# Patient Record
Sex: Male | Born: 1985 | Race: Black or African American | Hispanic: No | Marital: Single | Smoking: Never smoker
Health system: Southern US, Community
[De-identification: ages and names within clinical notes are randomized; demographics above are authoritative.]

## PROBLEM LIST (undated history)

## (undated) DIAGNOSIS — F909 Attention-deficit hyperactivity disorder, unspecified type: Secondary | ICD-10-CM

## (undated) DIAGNOSIS — R519 Headache, unspecified: Secondary | ICD-10-CM

## (undated) DIAGNOSIS — W3400XA Accidental discharge from unspecified firearms or gun, initial encounter: Secondary | ICD-10-CM

## (undated) DIAGNOSIS — R51 Headache: Secondary | ICD-10-CM

---
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
## 2004-09-12 ENCOUNTER — Emergency Department (HOSPITAL_COMMUNITY): Admission: EM | Admit: 2004-09-12 | Discharge: 2004-09-12 | Payer: Self-pay | Admitting: Emergency Medicine

## 2004-09-14 ENCOUNTER — Emergency Department (HOSPITAL_COMMUNITY): Admission: EM | Admit: 2004-09-14 | Discharge: 2004-09-14 | Payer: Self-pay | Admitting: Emergency Medicine

## 2004-09-17 ENCOUNTER — Emergency Department (HOSPITAL_COMMUNITY): Admission: EM | Admit: 2004-09-17 | Discharge: 2004-09-17 | Payer: Self-pay | Admitting: Emergency Medicine

## 2004-09-27 ENCOUNTER — Ambulatory Visit (HOSPITAL_COMMUNITY): Admission: RE | Admit: 2004-09-27 | Discharge: 2004-09-27 | Payer: Self-pay | Admitting: General Surgery

## 2005-05-31 ENCOUNTER — Emergency Department (HOSPITAL_COMMUNITY): Admission: EM | Admit: 2005-05-31 | Discharge: 2005-05-31 | Payer: Self-pay | Admitting: Emergency Medicine

## 2006-03-29 ENCOUNTER — Emergency Department (HOSPITAL_COMMUNITY): Admission: EM | Admit: 2006-03-29 | Discharge: 2006-03-29 | Payer: Self-pay | Admitting: Emergency Medicine

## 2010-03-31 ENCOUNTER — Emergency Department (HOSPITAL_COMMUNITY): Admission: EM | Admit: 2010-03-31 | Discharge: 2010-04-01 | Payer: Self-pay | Admitting: Emergency Medicine

## 2010-04-16 ENCOUNTER — Emergency Department (HOSPITAL_COMMUNITY): Admission: EM | Admit: 2010-04-16 | Discharge: 2010-04-16 | Payer: Self-pay | Admitting: Emergency Medicine

## 2013-08-09 ENCOUNTER — Encounter (HOSPITAL_COMMUNITY): Payer: Self-pay | Admitting: Emergency Medicine

## 2013-08-09 ENCOUNTER — Emergency Department (HOSPITAL_COMMUNITY)
Admission: EM | Admit: 2013-08-09 | Discharge: 2013-08-10 | Disposition: A | Discharge status: Home / Self Care | Payer: Self-pay | Attending: Emergency Medicine | Admitting: Emergency Medicine

## 2013-08-09 DIAGNOSIS — M549 Dorsalgia, unspecified: Secondary | ICD-10-CM

## 2013-08-09 DIAGNOSIS — R071 Chest pain on breathing: Secondary | ICD-10-CM | POA: Insufficient documentation

## 2013-08-09 DIAGNOSIS — F1210 Cannabis abuse, uncomplicated: Secondary | ICD-10-CM | POA: Insufficient documentation

## 2013-08-09 DIAGNOSIS — F17200 Nicotine dependence, unspecified, uncomplicated: Secondary | ICD-10-CM | POA: Insufficient documentation

## 2013-08-09 DIAGNOSIS — M546 Pain in thoracic spine: Secondary | ICD-10-CM | POA: Insufficient documentation

## 2013-08-09 DIAGNOSIS — R079 Chest pain, unspecified: Secondary | ICD-10-CM

## 2013-08-09 LAB — CBC
HCT: 41.7 % (ref 39.0–52.0)
Hemoglobin: 13.7 g/dL (ref 13.0–17.0)
MCHC: 32.9 g/dL (ref 30.0–36.0)
RBC: 4.43 MIL/uL (ref 4.22–5.81)
WBC: 6.2 10*3/uL (ref 4.0–10.5)

## 2013-08-09 LAB — BASIC METABOLIC PANEL
BUN: 10 mg/dL (ref 6–23)
CO2: 30 mEq/L (ref 19–32)
Chloride: 99 mEq/L (ref 96–112)
Glucose, Bld: 59 mg/dL — ABNORMAL LOW (ref 70–99)
Potassium: 3.9 mEq/L (ref 3.5–5.1)
Sodium: 138 mEq/L (ref 135–145)

## 2013-08-09 LAB — POCT I-STAT TROPONIN I

## 2013-08-09 MED ORDER — KETOROLAC TROMETHAMINE 60 MG/2ML IM SOLN
60.0000 mg | Freq: Once | INTRAMUSCULAR | Status: AC
  Administered 2013-08-09: 60 mg via INTRAMUSCULAR
  Filled 2013-08-09: qty 2

## 2013-08-09 NOTE — ED Notes (Signed)
Patient with chest pain that radiates into back, with shortness of breath.  Patient states that it has been going on for over a month.  Patient states that he has a hard time moving because of the pain.

## 2013-08-09 NOTE — ED Provider Notes (Signed)
CSN: [id number]     Arrival date & time 08/09/13  1940 History   First MD Initiated Contact with Patient 08/09/13 2228     Chief Complaint  Patient presents with  . Chest Pain  . Back Pain   (Consider location/radiation/quality/duration/timing/severity/associated sxs/prior Treatment) HPI Comments: Patient presents emergency department with chief complaint of back pain. Patient states that he has had some upper chest pain that radiates to his back for the past month and a half. He states it is worsened with deep breathing and with movement. He denies any known mechanism of injury. Denies any other health problems. No diabetes, high cholesterol, hypertension, or family history of heart disease. He does smoke every day, and uses marijuana, but no crack cocaine. He states that the symptoms are worsened with deep breathing, but denies actual shortness of breath. Denies fevers or chills. Denies any recent travel or surgeries.  The history is provided by the patient. No language interpreter was used.    History reviewed. No pertinent past medical history. History reviewed. No pertinent past surgical history. History reviewed. No pertinent family history. History  Substance Use Topics  . Smoking status: Current Every Day Smoker  . Smokeless tobacco: Not on file  . Alcohol Use: Yes     Comment: socially    Review of Systems  All other systems reviewed and are negative.    Allergies  Review of patient's allergies indicates no known allergies.  Home Medications  No current outpatient prescriptions on file. BP 107/65  Pulse 53  Temp(Src) 97.5 F (36.4 C) (Oral)  Resp 14  Ht 5\' 10"  (1.778 m)  Wt 156 lb 6.4 oz (70.943 kg)  BMI 22.44 kg/m2  SpO2 100% Physical Exam  Nursing note and vitals reviewed. Constitutional: He is oriented to person, place, and time. He appears well-developed and well-nourished. No distress.  HENT:  Head: Normocephalic and atraumatic.  Eyes: Conjunctivae and  EOM are normal. Right eye exhibits no discharge. Left eye exhibits no discharge. No scleral icterus.  Neck: Normal range of motion. Neck supple. No tracheal deviation present.  Cardiovascular: Normal rate, regular rhythm and normal heart sounds.  Exam reveals no gallop and no friction rub.   No murmur heard. Pulmonary/Chest: Effort normal and breath sounds normal. No respiratory distress. He has no wheezes.  Left anterior chest Klutts mildly tender to palpation  Abdominal: Soft. He exhibits no distension. There is no tenderness.  Musculoskeletal: Normal range of motion.  Left-sided rhomboid and upper trapezius muscles tender to palpation, no bony tenderness, step-offs, or gross abnormality or deformity of spine, patient is able to ambulate, moves all extremities    Neurological: He is alert and oriented to person, place, and time.  Sensation and strength intact bilaterally   Skin: Skin is warm. He is not diaphoretic.  Psychiatric: He has a normal mood and affect. His behavior is normal. Judgment and thought content normal.    ED Course  Procedures (including critical care time) Labs Review Labs Reviewed  BASIC METABOLIC PANEL - Abnormal; Notable for the following:    Glucose, Bld 59 (*)    All other components within normal limits  CBC  POCT I-STAT TROPONIN I   Imaging Review No results found.   ED ECG REPORT  I personally interpreted this EKG   Date: 08/09/2013   Rate: 62  Rhythm: normal sinus rhythm  QRS Axis: normal  Intervals: normal  ST/T Wave abnormalities: normal  Conduction Disutrbances:none  Narrative Interpretation:   Old EKG  Reviewed: none available   EKG Interpretation   None       MDM   1. Back pain   2. Chest pain     Patient with chest and back pain. Reproducible with palpation. Low risk for ACS. PERC negative. Will check chest x-ray, and give Toradol. Symptoms have been ongoing for over a month. Pain is also worse with movement. Vital signs are  stable. Will reevaluate. Anticipate discharge to home.  Patient discussed with Dr. Jeraldine Loots, who agrees with the plan. Will discharge to home with ibuprofen. Recommend PCP/orthopedic followup. Patient understands agrees with the plan.    Roxy Horseman, PA-C 08/10/13 [phone]

## 2013-08-10 ENCOUNTER — Emergency Department (HOSPITAL_COMMUNITY): Payer: Self-pay

## 2013-08-10 MED ORDER — IBUPROFEN 600 MG PO TABS: 600.0000 mg | ORAL_TABLET | Freq: Three times a day (TID) | ORAL | Status: DC

## 2013-08-10 NOTE — ED Provider Notes (Signed)
Please see the initial evaluation notes  Gerhard Munch, MD 08/10/13 2348

## 2013-08-10 NOTE — ED Provider Notes (Signed)
Xray negative   Arman Filter, NP 08/10/13 0416  Arman Filter, NP 08/10/13 [phone]

## 2013-08-10 NOTE — ED Provider Notes (Signed)
  Medical screening examination/treatment/procedure(s) were performed by non-physician practitioner and as supervising physician I was immediately available for consultation/collaboration.  EKG had sinus rhythm, rate 62, early repolarization, abnormal     Gerhard Munch, MD 08/10/13 2348

## 2014-04-18 ENCOUNTER — Emergency Department (HOSPITAL_COMMUNITY): Payer: Self-pay

## 2014-04-18 ENCOUNTER — Emergency Department (HOSPITAL_COMMUNITY)
Admission: EM | Admit: 2014-04-18 | Discharge: 2014-04-18 | Disposition: A | Discharge status: Home / Self Care | Payer: Self-pay | Attending: Emergency Medicine | Admitting: Emergency Medicine

## 2014-04-18 DIAGNOSIS — S99919A Unspecified injury of unspecified ankle, initial encounter: Secondary | ICD-10-CM

## 2014-04-18 DIAGNOSIS — F17200 Nicotine dependence, unspecified, uncomplicated: Secondary | ICD-10-CM | POA: Insufficient documentation

## 2014-04-18 DIAGNOSIS — IMO0002 Reserved for concepts with insufficient information to code with codable children: Secondary | ICD-10-CM | POA: Insufficient documentation

## 2014-04-18 DIAGNOSIS — S46909A Unspecified injury of unspecified muscle, fascia and tendon at shoulder and upper arm level, unspecified arm, initial encounter: Secondary | ICD-10-CM | POA: Insufficient documentation

## 2014-04-18 DIAGNOSIS — S8990XA Unspecified injury of unspecified lower leg, initial encounter: Secondary | ICD-10-CM | POA: Insufficient documentation

## 2014-04-18 DIAGNOSIS — S4980XA Other specified injuries of shoulder and upper arm, unspecified arm, initial encounter: Secondary | ICD-10-CM | POA: Insufficient documentation

## 2014-04-18 DIAGNOSIS — M25511 Pain in right shoulder: Secondary | ICD-10-CM

## 2014-04-18 DIAGNOSIS — S99929A Unspecified injury of unspecified foot, initial encounter: Secondary | ICD-10-CM

## 2014-04-18 DIAGNOSIS — Z791 Long term (current) use of non-steroidal anti-inflammatories (NSAID): Secondary | ICD-10-CM | POA: Insufficient documentation

## 2014-04-18 MED ORDER — NAPROXEN 500 MG PO TABS: 500.0000 mg | ORAL_TABLET | Freq: Two times a day (BID) | ORAL | Status: DC

## 2014-04-18 MED ORDER — ACETAMINOPHEN 325 MG PO TABS
650.0000 mg | ORAL_TABLET | Freq: Once | ORAL | Status: AC
  Administered 2014-04-18: 650 mg via ORAL
  Filled 2014-04-18: qty 2

## 2014-04-18 NOTE — ED Notes (Signed)
[employer] at bedside 

## 2014-04-18 NOTE — ED Notes (Signed)
Patient discharged with [employer] with all personal belongings.
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
## 2014-04-20 NOTE — ED Provider Notes (Signed)
Medical screening examination/treatment/procedure(s) were performed by non-physician practitioner and as supervising physician I was immediately available for consultation/collaboration.   EKG Interpretation None        Julie Manly, MD 04/20/14 0658 

## 2014-07-20 ENCOUNTER — Emergency Department (HOSPITAL_COMMUNITY)
Admission: EM | Admit: 2014-07-20 | Discharge: 2014-07-20 | Disposition: A | Discharge status: Home / Self Care | Payer: Self-pay | Attending: Emergency Medicine | Admitting: Emergency Medicine

## 2014-07-20 ENCOUNTER — Emergency Department (HOSPITAL_COMMUNITY): Payer: No Typology Code available for payment source

## 2014-07-20 ENCOUNTER — Encounter (HOSPITAL_COMMUNITY): Payer: Self-pay | Admitting: *Deleted

## 2014-07-20 DIAGNOSIS — Z791 Long term (current) use of non-steroidal anti-inflammatories (NSAID): Secondary | ICD-10-CM | POA: Insufficient documentation

## 2014-07-20 DIAGNOSIS — Y998 Other external cause status: Secondary | ICD-10-CM | POA: Insufficient documentation

## 2014-07-20 DIAGNOSIS — Z720 Tobacco use: Secondary | ICD-10-CM | POA: Insufficient documentation

## 2014-07-20 DIAGNOSIS — S299XXA Unspecified injury of thorax, initial encounter: Secondary | ICD-10-CM | POA: Insufficient documentation

## 2014-07-20 DIAGNOSIS — Y92410 Unspecified street and highway as the place of occurrence of the external cause: Secondary | ICD-10-CM | POA: Insufficient documentation

## 2014-07-20 DIAGNOSIS — Y9389 Activity, other specified: Secondary | ICD-10-CM | POA: Insufficient documentation

## 2014-07-20 DIAGNOSIS — S0093XA Contusion of unspecified part of head, initial encounter: Secondary | ICD-10-CM | POA: Insufficient documentation

## 2014-07-20 DIAGNOSIS — R52 Pain, unspecified: Secondary | ICD-10-CM

## 2014-07-20 MED ORDER — IBUPROFEN 800 MG PO TABS
800.0000 mg | ORAL_TABLET | Freq: Once | ORAL | Status: AC
  Administered 2014-07-20: 800 mg via ORAL
  Filled 2014-07-20: qty 1

## 2014-07-20 MED ORDER — IBUPROFEN 800 MG PO TABS: 800.0000 mg | ORAL_TABLET | Freq: Three times a day (TID) | ORAL | Status: DC

## 2014-07-20 MED ORDER — METHOCARBAMOL 500 MG PO TABS: 500.0000 mg | ORAL_TABLET | Freq: Two times a day (BID) | ORAL | Status: DC

## 2014-07-20 NOTE — ED Notes (Signed)
NAD at this time.  

## 2014-07-20 NOTE — ED Notes (Signed)
Patient transported to X-ray 
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
## 2014-07-20 NOTE — Discharge Instructions (Signed)
Cervical Sprain °A cervical sprain is an injury in the neck in which the strong, fibrous tissues (ligaments) that connect your neck bones stretch or tear. Cervical sprains can range from mild to severe. Severe cervical sprains can cause the neck vertebrae to be unstable. This can lead to damage of the spinal cord and can result in serious nervous system problems. The amount of time it takes for a cervical sprain to get better depends on the cause and extent of the injury. Most cervical sprains heal in 1 to 3 weeks. °CAUSES  °Severe cervical sprains may be caused by:  °· Contact sport injuries (such as from football, rugby, wrestling, hockey, auto racing, gymnastics, diving, martial arts, or boxing).   °· Motor vehicle collisions.   °· Whiplash injuries. This is an injury from a sudden forward and backward whipping movement of the head and neck.  °· Falls.   °Mild cervical sprains may be caused by:  °· Being in an awkward position, such as while cradling a telephone between your ear and shoulder.   °· Sitting in a chair that does not offer proper support.   °· Working at a poorly designed computer station.   °· Looking up or down for long periods of time.   °SYMPTOMS  °· Pain, soreness, stiffness, or a burning sensation in the front, back, or sides of the neck. This discomfort may develop immediately after the injury or slowly, 24 hours or more after the injury.   °· Pain or tenderness directly in the middle of the back of the neck.   °· Shoulder or upper back pain.   °· Limited ability to move the neck.   °· Headache.   °· Dizziness.   °· Weakness, numbness, or tingling in the hands or arms.   °· Muscle spasms.   °· Difficulty swallowing or chewing.   °· Tenderness and swelling of the neck.   °DIAGNOSIS  °Most of the time your health care provider can diagnose a cervical sprain by taking your history and doing a physical exam. Your health care provider will ask about previous neck injuries and any known neck  problems, such as arthritis in the neck. X-rays may be taken to find out if there are any other problems, such as with the bones of the neck. Other tests, such as a CT scan or MRI, may also be needed.  °TREATMENT  °Treatment depends on the severity of the cervical sprain. Mild sprains can be treated with rest, keeping the neck in place (immobilization), and pain medicines. Severe cervical sprains are immediately immobilized. Further treatment is done to help with pain, muscle spasms, and other symptoms and may include: °· Medicines, such as pain relievers, numbing medicines, or muscle relaxants.   °· Physical therapy. This may involve stretching exercises, strengthening exercises, and posture training. Exercises and improved posture can help stabilize the neck, strengthen muscles, and help stop symptoms from returning.   °HOME CARE INSTRUCTIONS  °· Put ice on the injured area.   °¨ Put ice in a plastic bag.   °¨ Place a towel between your skin and the bag.   °¨ Leave the ice on for 15-20 minutes, 3-4 times a day.   °· If your injury was severe, you may have been given a cervical collar to wear. A cervical collar is a two-piece collar designed to keep your neck from moving while it heals. °¨ Do not remove the collar unless instructed by your health care provider. °¨ If you have long hair, keep it outside of the collar. °¨ Ask your health care provider before making any adjustments to your collar. Minor   adjustments may be required over time to improve comfort and reduce pressure on your chin or on the back of your head. °¨ If you are allowed to remove the collar for cleaning or bathing, follow your health care provider's instructions on how to do so safely. °¨ Keep your collar clean by wiping it with mild soap and water and drying it completely. If the collar you have been given includes removable pads, remove them every 1-2 days and hand wash them with soap and water. Allow them to air dry. They should be completely  dry before you wear them in the collar. °¨ If you are allowed to remove the collar for cleaning and bathing, wash and dry the skin of your neck. Check your skin for irritation or sores. If you see any, tell your health care provider. °¨ Do not drive while wearing the collar.   °· Only take over-the-counter or prescription medicines for pain, discomfort, or fever as directed by your health care provider.   °· Keep all follow-up appointments as directed by your health care provider.   °· Keep all physical therapy appointments as directed by your health care provider.   °· Make any needed adjustments to your workstation to promote good posture.   °· Avoid positions and activities that make your symptoms worse.   °· Warm up and stretch before being active to help prevent problems.   °SEEK MEDICAL CARE IF:  °· Your pain is not controlled with medicine.   °· You are unable to decrease your pain medicine over time as planned.   °· Your activity level is not improving as expected.   °SEEK IMMEDIATE MEDICAL CARE IF:  °· You develop any bleeding. °· You develop stomach upset. °· You have signs of an allergic reaction to your medicine.   °· Your symptoms get worse.   °· You develop new, unexplained symptoms.   °· You have numbness, tingling, weakness, or paralysis in any part of your body.   °MAKE SURE YOU:  °· Understand these instructions. °· Will watch your condition. °· Will get help right away if you are not doing well or get worse. °Document Released: 06/18/2007 Document Revised: 08/26/2013 Document Reviewed: 02/26/2013 °[employer]® Patient Information ©2015 [employer]. This information is not intended to replace advice given to you by your health care provider. Make sure you discuss any questions you have with your health care provider. ° °Motor Vehicle Collision °It is common to have multiple bruises and sore muscles after a motor vehicle collision (MVC). These tend to feel worse for the first 24 hours. You may have  the most stiffness and soreness over the first several hours. You may also feel worse when you wake up the first morning after your collision. After this point, you will usually begin to improve with each day. The speed of improvement often depends on the severity of the collision, the number of injuries, and the location and nature of these injuries. °HOME CARE INSTRUCTIONS °· Put ice on the injured area. °¨ Put ice in a plastic bag. °¨ Place a towel between your skin and the bag. °¨ Leave the ice on for 15-20 minutes, 3-4 times a day, or as directed by your health care provider. °· Drink enough fluids to keep your urine clear or pale yellow. Do not drink alcohol. °· Take a warm shower or bath once or twice a day. This will increase blood flow to sore muscles. °· You may return to activities as directed by your caregiver. Be careful when lifting, as this may aggravate neck or back   pain. °· Only take over-the-counter or prescription medicines for pain, discomfort, or fever as directed by your caregiver. Do not use aspirin. This may increase bruising and bleeding. °SEEK IMMEDIATE MEDICAL CARE IF: °· You have numbness, tingling, or weakness in the arms or legs. °· You develop severe headaches not relieved with medicine. °· You have severe neck pain, especially tenderness in the middle of the back of your neck. °· You have changes in bowel or bladder control. °· There is increasing pain in any area of the body. °· You have shortness of breath, light-headedness, dizziness, or fainting. °· You have chest pain. °· You feel sick to your stomach (nauseous), throw up (vomit), or sweat. °· You have increasing abdominal discomfort. °· There is blood in your urine, stool, or vomit. °· You have pain in your shoulder (shoulder strap areas). °· You feel your symptoms are getting worse. °MAKE SURE YOU: °· Understand these instructions. °· Will watch your condition. °· Will get help right away if you are not doing well or get  worse. °Document Released: 08/21/2005 Document Revised: 01/05/2014 Document Reviewed: 01/18/2011 °[employer]® Patient Information ©2015 [employer]. This information is not intended to replace advice given to you by your health care provider. Make sure you discuss any questions you have with your health care provider. ° °
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
## 2016-10-30 ENCOUNTER — Emergency Department (HOSPITAL_COMMUNITY): Payer: Self-pay

## 2016-10-30 ENCOUNTER — Encounter (HOSPITAL_COMMUNITY): Payer: Self-pay | Admitting: Emergency Medicine

## 2016-10-30 ENCOUNTER — Emergency Department (HOSPITAL_COMMUNITY)
Admission: EM | Admit: 2016-10-30 | Discharge: 2016-10-31 | Disposition: A | Discharge status: Home / Self Care | Payer: Self-pay | Attending: Emergency Medicine | Admitting: Emergency Medicine

## 2016-10-30 DIAGNOSIS — F17200 Nicotine dependence, unspecified, uncomplicated: Secondary | ICD-10-CM | POA: Insufficient documentation

## 2016-10-30 DIAGNOSIS — S42032A Displaced fracture of lateral end of left clavicle, initial encounter for closed fracture: Secondary | ICD-10-CM | POA: Insufficient documentation

## 2016-10-30 DIAGNOSIS — Y939 Activity, unspecified: Secondary | ICD-10-CM | POA: Insufficient documentation

## 2016-10-30 DIAGNOSIS — Z23 Encounter for immunization: Secondary | ICD-10-CM | POA: Insufficient documentation

## 2016-10-30 DIAGNOSIS — S42002A Fracture of unspecified part of left clavicle, initial encounter for closed fracture: Secondary | ICD-10-CM

## 2016-10-30 DIAGNOSIS — W3400XA Accidental discharge from unspecified firearms or gun, initial encounter: Secondary | ICD-10-CM | POA: Insufficient documentation

## 2016-10-30 DIAGNOSIS — Y929 Unspecified place or not applicable: Secondary | ICD-10-CM | POA: Insufficient documentation

## 2016-10-30 DIAGNOSIS — Y999 Unspecified external cause status: Secondary | ICD-10-CM | POA: Insufficient documentation

## 2016-10-30 LAB — COMPREHENSIVE METABOLIC PANEL
ALBUMIN: 4.1 g/dL (ref 3.5–5.0)
ALT: 9 U/L — ABNORMAL LOW (ref 17–63)
AST: 28 U/L (ref 15–41)
Alkaline Phosphatase: 45 U/L (ref 38–126)
Anion gap: 7 (ref 5–15)
BILIRUBIN TOTAL: 1.8 mg/dL — AB (ref 0.3–1.2)
BUN: 10 mg/dL (ref 6–20)
CO2: 27 mmol/L (ref 22–32)
Calcium: 9.1 mg/dL (ref 8.9–10.3)
Chloride: 106 mmol/L (ref 101–111)
Creatinine, Ser: 1.07 mg/dL (ref 0.61–1.24)
GFR calc Af Amer: >60 mL/min (ref >60)
GFR calc non Af Amer: >60 mL/min (ref >60)
GLUCOSE: 114 mg/dL — AB (ref 65–99)
POTASSIUM: 4.4 mmol/L (ref 3.5–5.1)
Sodium: 140 mmol/L (ref 135–145)
TOTAL PROTEIN: 7.4 g/dL (ref 6.5–8.1)

## 2016-10-30 LAB — CBC
HCT: 41 % (ref 39.0–52.0)
HEMOGLOBIN: 13.4 g/dL (ref 13.0–17.0)
MCH: 30 pg (ref 26.0–34.0)
MCHC: 32.7 g/dL (ref 30.0–36.0)
MCV: 91.9 fL (ref 78.0–100.0)
Platelets: 291 10*3/uL (ref 150–400)
RBC: 4.46 MIL/uL (ref 4.22–5.81)
RDW: 12.4 % (ref 11.5–15.5)
WBC: 9.6 10*3/uL (ref 4.0–10.5)

## 2016-10-30 LAB — I-STAT CHEM 8, ED
BUN: 15 mg/dL (ref 6–20)
CALCIUM ION: 1.11 mmol/L — AB (ref 1.15–1.40)
Chloride: 103 mmol/L (ref 101–111)
Creatinine, Ser: 1 mg/dL (ref 0.61–1.24)
Glucose, Bld: 117 mg/dL — ABNORMAL HIGH (ref 65–99)
HCT: 42 % (ref 39.0–52.0)
Hemoglobin: 14.3 g/dL (ref 13.0–17.0)
Potassium: 4.3 mmol/L (ref 3.5–5.1)
SODIUM: 142 mmol/L (ref 135–145)
TCO2: 29 mmol/L (ref 0–100)

## 2016-10-30 LAB — PROTIME-INR
INR: 1.05
PROTHROMBIN TIME: 13.8 s (ref 11.4–15.2)

## 2016-10-30 LAB — I-STAT CG4 LACTIC ACID, ED: Lactic Acid, Venous: 1.71 mmol/L (ref 0.5–1.9)

## 2016-10-30 MED ORDER — TETANUS-DIPHTH-ACELL PERTUSSIS 5-2.5-18.5 LF-MCG/0.5 IM SUSP
INTRAMUSCULAR | Status: AC
  Filled 2016-10-30: qty 0.5

## 2016-10-30 MED ORDER — TETANUS-DIPHTH-ACELL PERTUSSIS 5-2.5-18.5 LF-MCG/0.5 IM SUSP: 0.5000 mL | Freq: Once | INTRAMUSCULAR | Status: DC

## 2016-10-30 MED ORDER — TETANUS-DIPHTH-ACELL PERTUSSIS 5-2.5-18.5 LF-MCG/0.5 IM SUSP
0.5000 mL | Freq: Once | INTRAMUSCULAR | Status: AC
  Administered 2016-10-30: 0.5 mL via INTRAMUSCULAR

## 2016-10-30 MED ORDER — FENTANYL CITRATE (PF) 100 MCG/2ML IJ SOLN
50.0000 ug | Freq: Once | INTRAMUSCULAR | Status: AC
  Administered 2016-10-31: 50 ug via INTRAVENOUS
  Filled 2016-10-30 (×2): qty 2

## 2016-10-30 MED ORDER — IOPAMIDOL (ISOVUE-370) INJECTION 76%
INTRAVENOUS | Status: AC
  Administered 2016-10-30: 100 mL
  Filled 2016-10-30: qty 100
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
## 2016-10-31 ENCOUNTER — Encounter (HOSPITAL_COMMUNITY): Payer: Self-pay | Admitting: Radiology

## 2016-10-31 LAB — PREPARE FRESH FROZEN PLASMA
UNIT DIVISION: 0
Unit division: 0

## 2016-10-31 LAB — CDS SEROLOGY

## 2016-10-31 MED ORDER — HYDROCODONE-ACETAMINOPHEN 5-325 MG PO TABS: 1.0000 | ORAL_TABLET | ORAL | 0 refills | Status: DC | PRN

## 2016-10-31 MED ORDER — OXYCODONE-ACETAMINOPHEN 5-325 MG PO TABS
1.0000 | ORAL_TABLET | Freq: Once | ORAL | Status: AC
  Administered 2016-10-31: 1 via ORAL
  Filled 2016-10-31: qty 1

## 2016-10-31 MED ORDER — CEPHALEXIN 500 MG PO CAPS: 500.0000 mg | ORAL_CAPSULE | Freq: Four times a day (QID) | ORAL | 0 refills | Status: DC

## 2016-10-31 NOTE — ED Notes (Signed)
Pt verbalized understanding od d/c instructions and has no further questuions. Pt ambulatory, A/ox4, VSS.

## 2016-10-31 NOTE — Discharge Instructions (Signed)
Wear the sling as prescribed. Follow up with Dr. Roda ShuttersXu. Return to the ED if you develop new or worsening symptoms.

## 2016-11-02 ENCOUNTER — Encounter (INDEPENDENT_AMBULATORY_CARE_PROVIDER_SITE_OTHER): Payer: Self-pay | Admitting: Orthopaedic Surgery

## 2016-11-02 ENCOUNTER — Ambulatory Visit (INDEPENDENT_AMBULATORY_CARE_PROVIDER_SITE_OTHER): Payer: Self-pay | Admitting: Orthopaedic Surgery

## 2016-11-02 DIAGNOSIS — S42035A Nondisplaced fracture of lateral end of left clavicle, initial encounter for closed fracture: Secondary | ICD-10-CM

## 2016-11-02 MED ORDER — METHOCARBAMOL 750 MG PO TABS: 750.0000 mg | ORAL_TABLET | Freq: Two times a day (BID) | ORAL | 3 refills | Status: DC | PRN

## 2016-11-02 MED ORDER — NAPROXEN 500 MG PO TABS: 500.0000 mg | ORAL_TABLET | Freq: Two times a day (BID) | ORAL | 3 refills | Status: DC

## 2016-11-02 MED ORDER — HYDROCODONE-ACETAMINOPHEN 7.5-325 MG PO TABS: 1.0000 | ORAL_TABLET | Freq: Four times a day (QID) | ORAL | 0 refills | Status: DC | PRN
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
## 2016-11-23 ENCOUNTER — Ambulatory Visit (INDEPENDENT_AMBULATORY_CARE_PROVIDER_SITE_OTHER): Payer: Self-pay | Admitting: Orthopaedic Surgery

## 2016-12-27 DIAGNOSIS — S42035A Nondisplaced fracture of lateral end of left clavicle, initial encounter for closed fracture: Secondary | ICD-10-CM | POA: Insufficient documentation

## 2016-12-28 ENCOUNTER — Ambulatory Visit (INDEPENDENT_AMBULATORY_CARE_PROVIDER_SITE_OTHER): Payer: Self-pay

## 2016-12-28 ENCOUNTER — Ambulatory Visit (INDEPENDENT_AMBULATORY_CARE_PROVIDER_SITE_OTHER): Payer: Self-pay | Admitting: Orthopaedic Surgery

## 2016-12-28 ENCOUNTER — Encounter (INDEPENDENT_AMBULATORY_CARE_PROVIDER_SITE_OTHER): Payer: Self-pay | Admitting: Orthopaedic Surgery

## 2016-12-28 DIAGNOSIS — S42035A Nondisplaced fracture of lateral end of left clavicle, initial encounter for closed fracture: Secondary | ICD-10-CM

## 2016-12-28 MED ORDER — HYDROCODONE-ACETAMINOPHEN 5-325 MG PO TABS: 1.0000 | ORAL_TABLET | Freq: Every day | ORAL | 0 refills | Status: DC | PRN
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
## 2017-02-06 ENCOUNTER — Observation Stay (HOSPITAL_COMMUNITY): Payer: Self-pay | Admitting: Anesthesiology

## 2017-02-06 ENCOUNTER — Emergency Department (HOSPITAL_COMMUNITY): Payer: Self-pay

## 2017-02-06 ENCOUNTER — Encounter (HOSPITAL_COMMUNITY): Payer: Self-pay | Admitting: Emergency Medicine

## 2017-02-06 ENCOUNTER — Inpatient Hospital Stay (HOSPITAL_COMMUNITY)
Admission: EM | Admit: 2017-02-06 | Discharge: 2017-02-08 | DRG: 501 | Payer: Self-pay | Attending: Orthopedic Surgery | Admitting: Orthopedic Surgery

## 2017-02-06 ENCOUNTER — Observation Stay (HOSPITAL_COMMUNITY): Payer: Self-pay

## 2017-02-06 ENCOUNTER — Encounter (HOSPITAL_COMMUNITY): Admission: EM | Payer: Self-pay | Source: Home / Self Care | Attending: Orthopedic Surgery

## 2017-02-06 DIAGNOSIS — T79A0XA Compartment syndrome, unspecified, initial encounter: Secondary | ICD-10-CM | POA: Diagnosis present

## 2017-02-06 DIAGNOSIS — S51831A Puncture wound without foreign body of right forearm, initial encounter: Secondary | ICD-10-CM

## 2017-02-06 DIAGNOSIS — F17210 Nicotine dependence, cigarettes, uncomplicated: Secondary | ICD-10-CM

## 2017-02-06 DIAGNOSIS — S61431A Puncture wound without foreign body of right hand, initial encounter: Secondary | ICD-10-CM | POA: Diagnosis present

## 2017-02-06 DIAGNOSIS — S62602B Fracture of unspecified phalanx of right middle finger, initial encounter for open fracture: Principal | ICD-10-CM | POA: Diagnosis present

## 2017-02-06 DIAGNOSIS — W3400XA Accidental discharge from unspecified firearms or gun, initial encounter: Secondary | ICD-10-CM

## 2017-02-06 HISTORY — DX: Accidental discharge from unspecified firearms or gun, initial encounter: W34.00XA

## 2017-02-06 HISTORY — PX: OPEN REDUCTION INTERNAL FIXATION (ORIF) DISTAL PHALANX: SHX6236

## 2017-02-06 LAB — CBC WITH DIFFERENTIAL/PLATELET
BASOS ABS: 0.1 10*3/uL (ref 0.0–0.1)
BASOS PCT: 0 %
EOS ABS: 0.2 10*3/uL (ref 0.0–0.7)
EOS PCT: 2 %
HCT: 43 % (ref 39.0–52.0)
HEMOGLOBIN: 13.7 g/dL (ref 13.0–17.0)
LYMPHS ABS: 5.7 10*3/uL — AB (ref 0.7–4.0)
Lymphocytes Relative: 49 %
MCH: 30 pg (ref 26.0–34.0)
MCHC: 31.9 g/dL (ref 30.0–36.0)
MCV: 94.3 fL (ref 78.0–100.0)
Monocytes Absolute: 0.6 10*3/uL (ref 0.1–1.0)
Monocytes Relative: 5 %
NEUTROS PCT: 44 %
Neutro Abs: 5.1 10*3/uL (ref 1.7–7.7)
PLATELETS: 307 10*3/uL (ref 150–400)
RBC: 4.56 MIL/uL (ref 4.22–5.81)
RDW: 12.4 % (ref 11.5–15.5)
WBC: 11.7 10*3/uL — AB (ref 4.0–10.5)

## 2017-02-06 LAB — I-STAT CHEM 8, ED
BUN: 13 mg/dL (ref 6–20)
CALCIUM ION: 1.15 mmol/L (ref 1.15–1.40)
CHLORIDE: 101 mmol/L (ref 101–111)
Creatinine, Ser: 1.1 mg/dL (ref 0.61–1.24)
Glucose, Bld: 114 mg/dL — ABNORMAL HIGH (ref 65–99)
HCT: 44 % (ref 39.0–52.0)
HEMOGLOBIN: 15 g/dL (ref 13.0–17.0)
Potassium: 3.6 mmol/L (ref 3.5–5.1)
SODIUM: 141 mmol/L (ref 135–145)
TCO2: 28 mmol/L (ref 0–100)

## 2017-02-06 SURGERY — OPEN REDUCTION INTERNAL FIXATION (ORIF) DISTAL PHALANX
Anesthesia: General | Site: Arm Lower | Laterality: Right

## 2017-02-06 MED ORDER — DOCUSATE SODIUM 100 MG PO CAPS
100.0000 mg | ORAL_CAPSULE | Freq: Two times a day (BID) | ORAL | Status: DC
  Administered 2017-02-06 – 2017-02-08 (×4): 100 mg via ORAL
  Filled 2017-02-06 (×5): qty 1

## 2017-02-06 MED ORDER — ONDANSETRON HCL 4 MG PO TABS: 4.0000 mg | ORAL_TABLET | Freq: Four times a day (QID) | ORAL | Status: DC | PRN

## 2017-02-06 MED ORDER — ONDANSETRON HCL 4 MG/2ML IJ SOLN
INTRAMUSCULAR | Status: AC
  Filled 2017-02-06: qty 2

## 2017-02-06 MED ORDER — LACTATED RINGERS IV SOLN
INTRAVENOUS | Status: DC
  Administered 2017-02-06: 21:00:00 via INTRAVENOUS

## 2017-02-06 MED ORDER — MIDAZOLAM HCL 2 MG/2ML IJ SOLN
INTRAMUSCULAR | Status: AC
  Filled 2017-02-06: qty 2

## 2017-02-06 MED ORDER — MORPHINE SULFATE (PF) 4 MG/ML IV SOLN
2.0000 mg | INTRAVENOUS | Status: DC | PRN
  Administered 2017-02-06 – 2017-02-07 (×6): 2 mg via INTRAVENOUS
  Filled 2017-02-06 (×6): qty 1

## 2017-02-06 MED ORDER — CHLORHEXIDINE GLUCONATE 4 % EX LIQD
60.0000 mL | Freq: Once | CUTANEOUS | Status: DC
  Filled 2017-02-06: qty 60

## 2017-02-06 MED ORDER — CEFAZOLIN SODIUM-DEXTROSE 1-4 GM/50ML-% IV SOLN
1.0000 g | Freq: Three times a day (TID) | INTRAVENOUS | Status: DC
  Administered 2017-02-07 – 2017-02-08 (×5): 1 g via INTRAVENOUS
  Filled 2017-02-06 (×6): qty 50

## 2017-02-06 MED ORDER — ONDANSETRON HCL 4 MG/2ML IJ SOLN
INTRAMUSCULAR | Status: DC | PRN
  Administered 2017-02-06: 4 mg via INTRAVENOUS

## 2017-02-06 MED ORDER — MORPHINE SULFATE (PF) 2 MG/ML IV SOLN: 1.0000 mg | INTRAVENOUS | Status: DC | PRN

## 2017-02-06 MED ORDER — SUCCINYLCHOLINE CHLORIDE 20 MG/ML IJ SOLN
INTRAMUSCULAR | Status: DC | PRN
  Administered 2017-02-06: 160 mg via INTRAVENOUS

## 2017-02-06 MED ORDER — SUGAMMADEX SODIUM 200 MG/2ML IV SOLN
INTRAVENOUS | Status: DC | PRN
  Administered 2017-02-06: 150 mg via INTRAVENOUS

## 2017-02-06 MED ORDER — CEFAZOLIN SODIUM 1 G IJ SOLR
INTRAMUSCULAR | Status: DC | PRN
  Administered 2017-02-06: 2 g via INTRAMUSCULAR

## 2017-02-06 MED ORDER — FAMOTIDINE 20 MG PO TABS: 20.0000 mg | ORAL_TABLET | Freq: Two times a day (BID) | ORAL | Status: DC | PRN

## 2017-02-06 MED ORDER — FENTANYL CITRATE (PF) 250 MCG/5ML IJ SOLN
INTRAMUSCULAR | Status: AC
  Filled 2017-02-06: qty 5

## 2017-02-06 MED ORDER — OXYCODONE HCL 5 MG PO TABS
5.0000 mg | ORAL_TABLET | ORAL | Status: DC | PRN
  Administered 2017-02-06 – 2017-02-08 (×6): 10 mg via ORAL
  Filled 2017-02-06 (×7): qty 2

## 2017-02-06 MED ORDER — LIDOCAINE 2% (20 MG/ML) 5 ML SYRINGE
INTRAMUSCULAR | Status: AC
  Filled 2017-02-06: qty 5

## 2017-02-06 MED ORDER — PROPOFOL 10 MG/ML IV BOLUS
INTRAVENOUS | Status: DC | PRN
  Administered 2017-02-06: 200 mg via INTRAVENOUS

## 2017-02-06 MED ORDER — FENTANYL CITRATE (PF) 250 MCG/5ML IJ SOLN
INTRAMUSCULAR | Status: DC | PRN
  Administered 2017-02-06: 100 ug via INTRAVENOUS
  Administered 2017-02-06: 50 ug via INTRAVENOUS
  Administered 2017-02-06: 100 ug via INTRAVENOUS
  Administered 2017-02-06: 50 ug via INTRAVENOUS

## 2017-02-06 MED ORDER — PROMETHAZINE HCL 25 MG RE SUPP: 12.5000 mg | Freq: Four times a day (QID) | RECTAL | Status: DC | PRN

## 2017-02-06 MED ORDER — 0.9 % SODIUM CHLORIDE (POUR BTL) OPTIME
TOPICAL | Status: DC | PRN
  Administered 2017-02-06: 1000 mL

## 2017-02-06 MED ORDER — ACETAMINOPHEN 650 MG RE SUPP: 650.0000 mg | Freq: Four times a day (QID) | RECTAL | Status: DC | PRN

## 2017-02-06 MED ORDER — VITAMIN C 500 MG PO TABS
1000.0000 mg | ORAL_TABLET | Freq: Every day | ORAL | Status: DC
  Administered 2017-02-06 – 2017-02-08 (×3): 1000 mg via ORAL
  Filled 2017-02-06 (×3): qty 2

## 2017-02-06 MED ORDER — CEFAZOLIN SODIUM-DEXTROSE 1-4 GM/50ML-% IV SOLN
1.0000 g | Freq: Once | INTRAVENOUS | Status: AC
  Administered 2017-02-06: 1 g via INTRAVENOUS
  Filled 2017-02-06: qty 50

## 2017-02-06 MED ORDER — CEFAZOLIN SODIUM 1 G IJ SOLR
INTRAMUSCULAR | Status: AC
  Filled 2017-02-06: qty 20

## 2017-02-06 MED ORDER — CEFAZOLIN SODIUM-DEXTROSE 1-4 GM/50ML-% IV SOLN
1.0000 g | INTRAVENOUS | Status: AC
  Administered 2017-02-06: 1 g via INTRAVENOUS
  Filled 2017-02-06: qty 50

## 2017-02-06 MED ORDER — ONDANSETRON HCL 4 MG/2ML IJ SOLN: 4.0000 mg | Freq: Four times a day (QID) | INTRAMUSCULAR | Status: DC | PRN

## 2017-02-06 MED ORDER — POVIDONE-IODINE 10 % EX SWAB: 2.0000 "application " | Freq: Once | CUTANEOUS | Status: DC

## 2017-02-06 MED ORDER — MIDAZOLAM HCL 2 MG/2ML IJ SOLN
INTRAMUSCULAR | Status: DC | PRN
  Administered 2017-02-06: 2 mg via INTRAVENOUS

## 2017-02-06 MED ORDER — SODIUM CHLORIDE 0.9 % IV SOLN
INTRAVENOUS | Status: DC
  Administered 2017-02-06: 09:00:00 via INTRAVENOUS

## 2017-02-06 MED ORDER — ROCURONIUM BROMIDE 100 MG/10ML IV SOLN
INTRAVENOUS | Status: DC | PRN
  Administered 2017-02-06: 40 mg via INTRAVENOUS

## 2017-02-06 MED ORDER — MORPHINE SULFATE (PF) 4 MG/ML IV SOLN
6.0000 mg | Freq: Once | INTRAVENOUS | Status: AC
  Administered 2017-02-06: 6 mg via INTRAVENOUS
  Filled 2017-02-06: qty 2

## 2017-02-06 MED ORDER — MORPHINE SULFATE (PF) 4 MG/ML IV SOLN
8.0000 mg | Freq: Once | INTRAVENOUS | Status: AC
  Administered 2017-02-06: 8 mg via INTRAVENOUS
  Filled 2017-02-06: qty 2

## 2017-02-06 MED ORDER — LACTATED RINGERS IV SOLN
INTRAVENOUS | Status: DC
  Administered 2017-02-06 (×3): via INTRAVENOUS

## 2017-02-06 MED ORDER — FENTANYL CITRATE (PF) 100 MCG/2ML IJ SOLN: 25.0000 ug | INTRAMUSCULAR | Status: DC | PRN

## 2017-02-06 MED ORDER — PROPOFOL 10 MG/ML IV BOLUS
INTRAVENOUS | Status: AC
  Filled 2017-02-06: qty 20

## 2017-02-06 MED ORDER — ROCURONIUM BROMIDE 10 MG/ML (PF) SYRINGE
PREFILLED_SYRINGE | INTRAVENOUS | Status: AC
  Filled 2017-02-06: qty 5

## 2017-02-06 MED ORDER — CEFAZOLIN SODIUM-DEXTROSE 2-4 GM/100ML-% IV SOLN: 2.0000 g | INTRAVENOUS | Status: DC

## 2017-02-06 MED ORDER — ACETAMINOPHEN 325 MG PO TABS: 650.0000 mg | ORAL_TABLET | Freq: Four times a day (QID) | ORAL | Status: DC | PRN

## 2017-02-06 MED ORDER — ALPRAZOLAM 0.5 MG PO TABS: 0.5000 mg | ORAL_TABLET | Freq: Four times a day (QID) | ORAL | Status: DC | PRN

## 2017-02-06 MED ORDER — LIDOCAINE 2% (20 MG/ML) 5 ML SYRINGE
INTRAMUSCULAR | Status: DC | PRN
Start: 2017-02-06 — End: 2017-02-06
  Administered 2017-02-06: 15 mg via INTRAVENOUS

## 2017-02-06 SURGICAL SUPPLY — 61 items
BANDAGE ACE 3X5.8 VEL STRL LF (GAUZE/BANDAGES/DRESSINGS) ×4 IMPLANT
BANDAGE ACE 4X5 VEL STRL LF (GAUZE/BANDAGES/DRESSINGS) ×4 IMPLANT
BLADE CLIPPER SURG (BLADE) IMPLANT
BNDG CMPR 9X4 STRL LF SNTH (GAUZE/BANDAGES/DRESSINGS) ×2
BNDG ESMARK 4X9 LF (GAUZE/BANDAGES/DRESSINGS) ×4 IMPLANT
BNDG GAUZE ELAST 4 BULKY (GAUZE/BANDAGES/DRESSINGS) ×8 IMPLANT
CAST PADDING SYN 3 (CAST SUPPLIES) ×4 IMPLANT
CORDS BIPOLAR (ELECTRODE) ×4 IMPLANT
COVER SURGICAL LIGHT HANDLE (MISCELLANEOUS) ×4 IMPLANT
CUFF TOURNIQUET SINGLE 18IN (TOURNIQUET CUFF) ×4 IMPLANT
CUFF TOURNIQUET SINGLE 24IN (TOURNIQUET CUFF) IMPLANT
DECANTER SPIKE VIAL GLASS SM (MISCELLANEOUS) IMPLANT
DRAIN TLS ROUND 10FR (DRAIN) IMPLANT
DRAPE OEC MINIVIEW 54X84 (DRAPES) IMPLANT
DRAPE SURG 17X23 STRL (DRAPES) ×4 IMPLANT
DRAPE U-SHAPE 47X51 STRL (DRAPES) ×4 IMPLANT
GAUZE SPONGE 4X4 12PLY STRL (GAUZE/BANDAGES/DRESSINGS) ×4 IMPLANT
GAUZE SPONGE 4X4 12PLY STRL LF (GAUZE/BANDAGES/DRESSINGS) ×4 IMPLANT
GAUZE XEROFORM 1X8 LF (GAUZE/BANDAGES/DRESSINGS) ×4 IMPLANT
GLOVE BIOGEL M 8.0 STRL (GLOVE) ×4 IMPLANT
GLOVE SS BIOGEL STRL SZ 8 (GLOVE) ×2 IMPLANT
GLOVE SUPERSENSE BIOGEL SZ 8 (GLOVE) ×2
GOWN STRL REUS W/ TWL LRG LVL3 (GOWN DISPOSABLE) ×6 IMPLANT
GOWN STRL REUS W/ TWL XL LVL3 (GOWN DISPOSABLE) ×6 IMPLANT
GOWN STRL REUS W/TWL LRG LVL3 (GOWN DISPOSABLE) ×12
GOWN STRL REUS W/TWL XL LVL3 (GOWN DISPOSABLE) ×12
KIT BASIN OR (CUSTOM PROCEDURE TRAY) ×4 IMPLANT
KIT ROOM TURNOVER OR (KITS) ×4 IMPLANT
LOOP VESSEL MAXI BLUE (MISCELLANEOUS) ×3 IMPLANT
MANIFOLD NEPTUNE II (INSTRUMENTS) ×4 IMPLANT
NEEDLE 22X1 1/2 (OR ONLY) (NEEDLE) IMPLANT
NS IRRIG 1000ML POUR BTL (IV SOLUTION) ×4 IMPLANT
PACK ORTHO EXTREMITY (CUSTOM PROCEDURE TRAY) ×4 IMPLANT
PAD ARMBOARD 7.5X6 YLW CONV (MISCELLANEOUS) ×8 IMPLANT
PAD CAST 3X4 CTTN HI CHSV (CAST SUPPLIES) ×2 IMPLANT
PAD CAST 4YDX4 CTTN HI CHSV (CAST SUPPLIES) ×2 IMPLANT
PADDING CAST COTTON 3X4 STRL (CAST SUPPLIES) ×4
PADDING CAST COTTON 4X4 STRL (CAST SUPPLIES) ×4
PADDING CAST COTTON 6X4 STRL (CAST SUPPLIES) ×4 IMPLANT
PUTTY DBM STAGRAFT PLUS 2CC (Putty) ×3 IMPLANT
SCRUB BETADINE 4OZ XXX (MISCELLANEOUS) ×4 IMPLANT
SOL PREP POV-IOD 4OZ 10% (MISCELLANEOUS) ×4 IMPLANT
SPONGE LAP 4X18 X RAY DECT (DISPOSABLE) IMPLANT
SUT CHROMIC 5 0 P 3 (SUTURE) ×3 IMPLANT
SUT FIBER WIRE 4.0 (SUTURE) ×4 IMPLANT
SUT FIBERWIRE 4-0 18 TAPR NDL (SUTURE) ×4
SUT MNCRL AB 4-0 PS2 18 (SUTURE) ×4 IMPLANT
SUT PROLENE 3 0 PS 2 (SUTURE) IMPLANT
SUT PROLENE 4 0 PS 2 18 (SUTURE) ×4 IMPLANT
SUT VIC AB 3-0 FS2 27 (SUTURE) IMPLANT
SUTURE FIBERWR 4-0 18 TAPR NDL (SUTURE) ×1 IMPLANT
SYR CONTROL 10ML LL (SYRINGE) IMPLANT
SYSTEM CHEST DRAIN TLS 7FR (DRAIN) IMPLANT
TOWEL OR 17X24 6PK STRL BLUE (TOWEL DISPOSABLE) ×4 IMPLANT
TOWEL OR 17X26 10 PK STRL BLUE (TOWEL DISPOSABLE) ×4 IMPLANT
TUBE CONNECTING 12'X1/4 (SUCTIONS) ×1
TUBE CONNECTING 12X1/4 (SUCTIONS) ×3 IMPLANT
TUBE EVACUATION TLS (MISCELLANEOUS) ×4 IMPLANT
UNDERPAD 30X30 (UNDERPADS AND DIAPERS) ×4 IMPLANT
WATER STERILE IRR 1000ML POUR (IV SOLUTION) ×4 IMPLANT
WIRE K 1.1 (MISCELLANEOUS) ×18 IMPLANT
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
## 2017-02-06 NOTE — Progress Notes (Signed)
OR staff present to take patient to short stay for surgery. Patient requested staff to wait while he goes through his bag. Patient has black backpack on his lap. I explained to patient we can have security to keep belongings during surgery as [name] does not assume responsibility for valuables. I let him know the contents would be itemized and returned to him after surgery. Patient states, "I don't want my stuff in security, I don't trust the police." "I only want to give my stuff to my family". I asked if there was anything illegal in the bag, patient stated "no, I just have my rent money and I don't want it in security." Patient calling family to see when they can arrive to assume belongings. I notified short stay nurse regarding the patient's concern, who stated he can bring the bag with him to short stay and have family to meet him in short stay to retrieve bag and contents.
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
## 2017-02-06 NOTE — ED Notes (Signed)
Pt called out to nurses station reporting numbness and tingling to right forearm traveling down to fingers. +2 radial pulses noted bilaterally. PA Leotis ShamesJeffery made aware. Pt forearm further elevated.

## 2017-02-06 NOTE — Anesthesia Postprocedure Evaluation (Signed)
Anesthesia Post Note  Patient: Rosanne Guttingonhal XXXWAll  Procedure(s) Performed: Procedure(s) (LRB): I&D PRIMARY FUSION RIGHT MIDDLE PIP JOINT  AND FASCIOTOMY RIGHT FOREARM (Right)     Patient location during evaluation: PACU Anesthesia Type: General Level of consciousness: awake Pain management: pain level controlled Respiratory status: spontaneous breathing Cardiovascular status: stable Anesthetic complications: no    Last Vitals:  Vitals:   02/06/17 2008 02/06/17 2116  BP: 136/84 (!) 156/82  Pulse: 68 68  Resp: 16 16  Temp: 36.6 C 36.1 C    Last Pain:  Vitals:   02/06/17 2125  TempSrc:   PainSc: 9                  Deysha Cartier

## 2017-02-06 NOTE — Transfer of Care (Signed)
Immediate Anesthesia Transfer of Care Note  Patient: Carl Mueller  Procedure(s) Performed: Procedure(s): I&D PRIMARY FUSION RIGHT MIDDLE PIP JOINT  AND FASCIOTOMY RIGHT FOREARM (Right)  Patient Location: PACU  Anesthesia Type:General  Level of Consciousness: lethargic  Airway & Oxygen Therapy: Patient Spontanous Breathing and Patient connected to nasal cannula oxygen  Post-op Assessment: Report given to RN  Post vital signs: Reviewed and stable  Last Vitals:  Vitals:   02/06/17 1130 02/06/17 1220  BP: (!) 151/92 (!) 150/99  Pulse: 68 (!) 59  Resp: 16 10  Temp:  37.1 C    Last Pain:  Vitals:   02/06/17 1327  TempSrc:   PainSc: 10-Worst pain ever         Complications: No apparent anesthesia complications

## 2017-02-06 NOTE — Anesthesia Procedure Notes (Signed)
Procedure Name: Intubation Date/Time: 02/06/2017 4:03 PM Performed by: Sampson Si E Pre-anesthesia Checklist: Patient identified, Emergency Drugs available, Suction available and Patient being monitored Patient Re-evaluated:Patient Re-evaluated prior to inductionOxygen Delivery Method: Circle System Utilized Preoxygenation: Pre-oxygenation with 100% oxygen Intubation Type: IV induction, Rapid sequence and Cricoid Pressure applied Laryngoscope Size: Mac and 4 Grade View: Grade I Tube type: Oral Tube size: 7.5 mm Number of attempts: 1 Airway Equipment and Method: Stylet and Oral airway Placement Confirmation: ETT inserted through vocal cords under direct vision,  positive ETCO2 and breath sounds checked- equal and bilateral Secured at: 23 cm Tube secured with: Tape Dental Injury: Teeth and Oropharynx as per pre-operative assessment
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
## 2017-02-06 NOTE — Anesthesia Preprocedure Evaluation (Addendum)
Anesthesia Evaluation  Patient identified by MRN, date of birth, ID band Patient awake    Reviewed: Allergy & Precautions, NPO status , Patient's Chart, lab work & pertinent test results  Airway Mallampati: II       Dental  (+) Teeth Intact, Dental Advisory Given   Pulmonary Current Smoker,    breath sounds clear to auscultation       Cardiovascular negative cardio ROS   Rhythm:Regular     Neuro/Psych negative neurological ROS  negative psych ROS   GI/Hepatic negative GI ROS, Neg liver ROS,   Endo/Other  negative endocrine ROS  Renal/GU negative Renal ROS  negative genitourinary   Musculoskeletal negative musculoskeletal ROS (+)   Abdominal   Peds negative pediatric ROS (+)  Hematology negative hematology ROS (+)   Anesthesia Other Findings Day of surgery medications reviewed with the patient.  Reproductive/Obstetrics negative OB ROS                            Anesthesia Physical Anesthesia Plan  ASA: II and emergent  Anesthesia Plan: General   Post-op Pain Management:    Induction: Intravenous  PONV Risk Score and Plan: 1 and Ondansetron and Treatment may vary due to age  Airway Management Planned: LMA  Additional Equipment:   Intra-op Plan:   Post-operative Plan: Extubation in OR  Informed Consent: I have reviewed the patients History and Physical, chart, labs and discussed the procedure including the risks, benefits and alternatives for the proposed anesthesia with the patient or authorized representative who has indicated his/her understanding and acceptance.   Dental advisory given  Plan Discussed with: CRNA  Anesthesia Plan Comments: (Possible OETT (pending NPO status). NPO since yesterday (02/05/2017) per patient. )       Anesthesia Quick Evaluation

## 2017-02-06 NOTE — ED Notes (Signed)
Patient transported to X-ray on heart monitor  
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
## 2017-02-07 ENCOUNTER — Encounter (HOSPITAL_COMMUNITY): Payer: Self-pay | Admitting: Orthopedic Surgery

## 2017-02-07 LAB — HIV ANTIBODY (ROUTINE TESTING W REFLEX): HIV SCREEN 4TH GENERATION: NONREACTIVE

## 2017-02-07 MED ORDER — METHOCARBAMOL 500 MG PO TABS
500.0000 mg | ORAL_TABLET | Freq: Four times a day (QID) | ORAL | Status: DC | PRN
  Administered 2017-02-07: 500 mg via ORAL
  Filled 2017-02-07: qty 1
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
## 2017-02-07 NOTE — Progress Notes (Signed)
Subjective: 1 Day Post-Op Procedure(s) (LRB): I&D PRIMARY FUSION RIGHT MIDDLE PIP JOINT  AND FASCIOTOMY RIGHT FOREARM (Right) Patient reports pain as not well controlled with current po/iv regime. He denies nausea/vomiting fever or chills. Tolerating PO's.    Objective: Vital signs in last 24 hours: Temp:  [97 F (36.1 C)-98.7 F (37.1 C)] 98.7 F (37.1 C) (06/06 0604) Pulse Rate:  [55-93] 68 (06/06 0604) Resp:  [10-18] 18 (06/06 0604) BP: (117-156)/(75-101) 117/75 (06/06 0604) SpO2:  [95 %-100 %] 100 % (06/06 0604)  Intake/Output from previous day: 06/05 0701 - 06/06 0700 In: 2845 [P.O.:720; I.V.:2075; IV Piggyback:50] Out: 650 [Urine:600; Blood:50] Intake/Output this shift: Total I/O In: 2283.3 [I.V.:2283.3] Out: -    Recent Labs  02/06/17 0800 02/06/17 0808  HGB 13.7 15.0    Recent Labs  02/06/17 0800 02/06/17 0808  WBC 11.7*  --   RBC 4.56  --   HCT 43.0 44.0  PLT 307  --     Recent Labs  02/06/17 0808  NA 141  K 3.6  CL 101  BUN 13  CREATININE 1.10  GLUCOSE 114*   No results for input(s): LABPT, INR in the last 72 hours.    Assessment/Plan: 1 Day Post-Op Procedure(s) (LRB): I&D PRIMARY FUSION RIGHT MIDDLE PIP JOINT  AND FASCIOTOMY RIGHT FOREARM (Right) We will continue IV abx, close observation and will change his pain management regime to Dilaudid. We discussed with him tentative d/c tomorrow. All questions were encouraged and answered.  Tanganyika Bowlds L 02/07/2017, 4:58 PM
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
## 2017-02-08 ENCOUNTER — Encounter (HOSPITAL_COMMUNITY): Payer: Self-pay | Admitting: General Practice

## 2017-02-08 MED ORDER — CEPHALEXIN 500 MG PO CAPS: 500.0000 mg | ORAL_CAPSULE | Freq: Four times a day (QID) | ORAL | 0 refills | Status: DC

## 2017-02-08 MED ORDER — OXYCODONE HCL 5 MG PO TABS: 5.0000 mg | ORAL_TABLET | ORAL | 0 refills | Status: DC | PRN

## 2017-02-08 MED ORDER — HYDROMORPHONE HCL 2 MG PO TABS: 2.0000 mg | ORAL_TABLET | ORAL | Status: DC | PRN
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
## 2017-02-08 NOTE — Discharge Instructions (Signed)
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
## 2017-02-08 NOTE — Care Management Note (Signed)
Case Management Note  Patient Details  Name: Carl Mueller MRN: [id number] Date of Birth: [birth year]  Subjective/Objective:                    Action/Plan: [facility] [facility] No further needs per CM.  Expected Discharge Date:  02/09/17               Expected Discharge Plan:  Home/Self Care  In-House Referral:     Discharge planning Services     Post Acute Care Choice:    Choice offered to:     DME Arranged:    DME Agency:     HH Arranged:    HH Agency:     Status of Service:  Completed, signed off  If discussed at MicrosoftLong Length of Stay Meetings, dates discussed:    Additional Comments:  Kermit BaloKelli F Daneille Desilva, RN 02/08/2017, 3:32 PM

## 2017-02-08 NOTE — Progress Notes (Signed)
Orthopedic Tech Progress Note Patient Details:  Rosanne GuttingDonhal XXXWall [birth year] [id number]  Ortho Devices Type of Ortho Device: Arm sling Ortho Device/Splint Location: RUE Ortho Device/Splint Interventions: Ordered, Application   Jennye MoccasinHughes, Navpreet Szczygiel Craig 02/08/2017, 4:14 PM
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
## 2017-02-08 NOTE — Progress Notes (Signed)
Subjective: 2 Days Post-Op Procedure(s) (LRB): I&D PRIMARY FUSION RIGHT MIDDLE PIP JOINT  AND FASCIOTOMY RIGHT FOREARM (Right) Patient reports  difficulty with pain control. He denies nausea, vomiting, fever or chills.  Objective: Vital signs in last 24 hours: Temp:  [98.2 F (36.8 C)-99.1 F (37.3 C)] 99.1 F (37.3 C) (06/07 1355) Pulse Rate:  [54-64] 64 (06/07 1355) Resp:  [16-18] 18 (06/07 1355) BP: (127-152)/(71-96) 152/96 (06/07 1355) SpO2:  [98 %-100 %] 100 % (06/07 1355)  Intake/Output from previous day: 06/06 0701 - 06/07 0700 In: 2283.3 [I.V.:2283.3] Out: -  Intake/Output this shift: Total I/O In: 240 [P.O.:240] Out: -    Recent Labs  02/06/17 0800 02/06/17 0808  HGB 13.7 15.0    Recent Labs  02/06/17 0800 02/06/17 0808  WBC 11.7*  --   RBC 4.56  --   HCT 43.0 44.0  PLT 307  --     Recent Labs  02/06/17 0808  NA 141  K 3.6  CL 101  BUN 13  CREATININE 1.10  GLUCOSE 114*   No results for input(s): LABPT, INR in the last 72 hours.  The patient is alert and oriented in no acute distress. He is pleasant and very conversant. The patient complains of pain in the affected upper extremity.  The patient is noted to have a normal HEENT exam. Lung fields show equal chest expansion and no shortness of breath. Abdomen exam is nontender without distention. Lower extremity examination does not show any fracture dislocation or blood clot symptoms. Pelvis is stable and the neck and back are stable and nontender. Evaluation of the right upper extremity shows that his dressings are clean dry and intact. He has mild edema about the exposed digits and I have discussed with him massage and passive range of motion about the exposed digits. The middle finger is warm and does have refill about the exposed tip.  Assessment/Plan: 2 Days Post-Op Procedure(s) (LRB): I&D PRIMARY FUSION RIGHT MIDDLE PIP JOINT  AND FASCIOTOMY RIGHT FOREARM (Right) We have discussed with the  patient a continuation of IV antibiotics and pain management and we will plan on tentatively discharging him tomorrow. He will be discharged on Keflex for antibiotic purposes as well as oxycodone for pain control. I have discussed with him diligent elevation, edema control. We will plan on seeing him in our office in a week to 10 days for wound recheck. Will contact us for any questions or concerns during the interim.  Shem Plemmons L 02/08/2017, 2:15 PM

## 2017-02-27 ENCOUNTER — Ambulatory Visit (INDEPENDENT_AMBULATORY_CARE_PROVIDER_SITE_OTHER): Payer: No Typology Code available for payment source | Admitting: Orthopaedic Surgery

## 2017-03-09 ENCOUNTER — Ambulatory Visit (INDEPENDENT_AMBULATORY_CARE_PROVIDER_SITE_OTHER): Payer: Self-pay | Admitting: Orthopaedic Surgery

## 2017-03-16 ENCOUNTER — Ambulatory Visit (INDEPENDENT_AMBULATORY_CARE_PROVIDER_SITE_OTHER): Payer: Self-pay

## 2017-03-16 ENCOUNTER — Ambulatory Visit (INDEPENDENT_AMBULATORY_CARE_PROVIDER_SITE_OTHER): Payer: Self-pay | Admitting: Orthopaedic Surgery

## 2017-03-16 DIAGNOSIS — S42035A Nondisplaced fracture of lateral end of left clavicle, initial encounter for closed fracture: Secondary | ICD-10-CM
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
## 2017-05-30 ENCOUNTER — Ambulatory Visit: Payer: Self-pay | Admitting: Orthopedic Surgery

## 2017-06-06 ENCOUNTER — Encounter (HOSPITAL_COMMUNITY): Payer: Self-pay | Admitting: *Deleted

## 2017-06-06 NOTE — Progress Notes (Signed)
Pt denies SOB, chest pain, and being under the care of a cardiologist. Pt denies having a stress test, echo and cardiac cath. Pt denies having an EKG within the last year. Pt denies recent labs. Pt made aware to stop taking  Aspirin, vitamins, fish oil and herbal medications. Do not take any NSAIDs ie: Ibuprofen, Advil, Naproxen (Aleve), Motrin, BC and Goody Powder or any medication containing Aspirin. Pt verbalized understanding of all pre-op instructions.

## 2017-06-07 ENCOUNTER — Ambulatory Visit (HOSPITAL_COMMUNITY): Payer: Self-pay | Admitting: Certified Registered"

## 2017-06-07 ENCOUNTER — Ambulatory Visit (HOSPITAL_COMMUNITY)
Admission: RE | Admit: 2017-06-07 | Discharge: 2017-06-07 | Disposition: A | Discharge status: Home / Self Care | Payer: Self-pay | Source: Ambulatory Visit | Attending: Orthopedic Surgery | Admitting: Orthopedic Surgery

## 2017-06-07 ENCOUNTER — Encounter (HOSPITAL_COMMUNITY)
Admission: RE | Disposition: A | Discharge status: Home / Self Care | Payer: Self-pay | Source: Ambulatory Visit | Attending: Orthopedic Surgery

## 2017-06-07 ENCOUNTER — Encounter (HOSPITAL_COMMUNITY): Payer: Self-pay | Admitting: Urology

## 2017-06-07 DIAGNOSIS — F17200 Nicotine dependence, unspecified, uncomplicated: Secondary | ICD-10-CM | POA: Insufficient documentation

## 2017-06-07 DIAGNOSIS — M24641 Ankylosis, right hand: Secondary | ICD-10-CM | POA: Insufficient documentation

## 2017-06-07 DIAGNOSIS — Z472 Encounter for removal of internal fixation device: Secondary | ICD-10-CM | POA: Insufficient documentation

## 2017-06-07 DIAGNOSIS — Z87828 Personal history of other (healed) physical injury and trauma: Secondary | ICD-10-CM | POA: Insufficient documentation

## 2017-06-07 HISTORY — DX: Headache: R51

## 2017-06-07 HISTORY — DX: Headache, unspecified: R51.9

## 2017-06-07 HISTORY — PX: HARDWARE REMOVAL: SHX979

## 2017-06-07 LAB — COMPREHENSIVE METABOLIC PANEL
ALK PHOS: 51 U/L (ref 38–126)
ALT: 10 U/L — AB (ref 17–63)
AST: 20 U/L (ref 15–41)
Albumin: 4.3 g/dL (ref 3.5–5.0)
Anion gap: 7 (ref 5–15)
BUN: 9 mg/dL (ref 6–20)
CALCIUM: 9.4 mg/dL (ref 8.9–10.3)
CHLORIDE: 102 mmol/L (ref 101–111)
CO2: 29 mmol/L (ref 22–32)
Creatinine, Ser: 1 mg/dL (ref 0.61–1.24)
GFR calc Af Amer: >60 mL/min (ref >60)
GFR calc non Af Amer: >60 mL/min (ref >60)
GLUCOSE: 92 mg/dL (ref 65–99)
Potassium: 4 mmol/L (ref 3.5–5.1)
SODIUM: 138 mmol/L (ref 135–145)
Total Bilirubin: 1 mg/dL (ref 0.3–1.2)
Total Protein: 7.7 g/dL (ref 6.5–8.1)

## 2017-06-07 LAB — SURGICAL PCR SCREEN
MRSA, PCR: NEGATIVE
Staphylococcus aureus: NEGATIVE

## 2017-06-07 SURGERY — REMOVAL, HARDWARE
Anesthesia: General | Site: Finger | Laterality: Right

## 2017-06-07 MED ORDER — OXYCODONE HCL 5 MG/5ML PO SOLN: 5.0000 mg | Freq: Once | ORAL | Status: DC | PRN

## 2017-06-07 MED ORDER — HYDROMORPHONE HCL 1 MG/ML IJ SOLN: 0.2500 mg | INTRAMUSCULAR | Status: DC | PRN

## 2017-06-07 MED ORDER — FENTANYL CITRATE (PF) 100 MCG/2ML IJ SOLN
INTRAMUSCULAR | Status: DC | PRN
  Administered 2017-06-07: 100 ug via INTRAVENOUS

## 2017-06-07 MED ORDER — HYDROCODONE-ACETAMINOPHEN 5-325 MG PO TABS: 2.0000 | ORAL_TABLET | Freq: Four times a day (QID) | ORAL | 0 refills | Status: DC | PRN

## 2017-06-07 MED ORDER — LIDOCAINE 2% (20 MG/ML) 5 ML SYRINGE
INTRAMUSCULAR | Status: DC | PRN
  Administered 2017-06-07: 100 mg via INTRAVENOUS

## 2017-06-07 MED ORDER — ONDANSETRON HCL 4 MG/2ML IJ SOLN
INTRAMUSCULAR | Status: DC | PRN
  Administered 2017-06-07: 4 mg via INTRAVENOUS

## 2017-06-07 MED ORDER — BUPIVACAINE HCL (PF) 0.25 % IJ SOLN
INTRAMUSCULAR | Status: AC
Start: 2017-06-07 — End: ?
  Filled 2017-06-07: qty 30

## 2017-06-07 MED ORDER — CHLORHEXIDINE GLUCONATE 4 % EX LIQD: 60.0000 mL | Freq: Once | CUTANEOUS | Status: DC

## 2017-06-07 MED ORDER — 0.9 % SODIUM CHLORIDE (POUR BTL) OPTIME
TOPICAL | Status: DC | PRN
  Administered 2017-06-07: 1000 mL

## 2017-06-07 MED ORDER — ONDANSETRON HCL 4 MG/2ML IJ SOLN
INTRAMUSCULAR | Status: AC
  Filled 2017-06-07: qty 2

## 2017-06-07 MED ORDER — PROPOFOL 10 MG/ML IV BOLUS
INTRAVENOUS | Status: AC
  Filled 2017-06-07: qty 20

## 2017-06-07 MED ORDER — PROMETHAZINE HCL 25 MG/ML IJ SOLN: 6.2500 mg | INTRAMUSCULAR | Status: DC | PRN

## 2017-06-07 MED ORDER — FENTANYL CITRATE (PF) 250 MCG/5ML IJ SOLN
INTRAMUSCULAR | Status: AC
  Filled 2017-06-07: qty 5

## 2017-06-07 MED ORDER — CEFAZOLIN SODIUM-DEXTROSE 2-4 GM/100ML-% IV SOLN
INTRAVENOUS | Status: AC
  Filled 2017-06-07: qty 100

## 2017-06-07 MED ORDER — CEFAZOLIN SODIUM-DEXTROSE 2-4 GM/100ML-% IV SOLN
2.0000 g | INTRAVENOUS | Status: AC
  Administered 2017-06-07: 2 g via INTRAVENOUS

## 2017-06-07 MED ORDER — MIDAZOLAM HCL 2 MG/2ML IJ SOLN
INTRAMUSCULAR | Status: AC
  Filled 2017-06-07: qty 2

## 2017-06-07 MED ORDER — PROPOFOL 10 MG/ML IV BOLUS
INTRAVENOUS | Status: DC | PRN
  Administered 2017-06-07: 200 mg via INTRAVENOUS

## 2017-06-07 MED ORDER — OXYCODONE HCL 5 MG PO TABS: 5.0000 mg | ORAL_TABLET | Freq: Once | ORAL | Status: DC | PRN

## 2017-06-07 MED ORDER — BUPIVACAINE HCL (PF) 0.25 % IJ SOLN
INTRAMUSCULAR | Status: DC | PRN
Start: 2017-06-07 — End: 2017-06-07
  Administered 2017-06-07: 30 mL

## 2017-06-07 MED ORDER — MEPERIDINE HCL 25 MG/ML IJ SOLN: 6.2500 mg | INTRAMUSCULAR | Status: DC | PRN

## 2017-06-07 MED ORDER — LIDOCAINE 2% (20 MG/ML) 5 ML SYRINGE
INTRAMUSCULAR | Status: AC
  Filled 2017-06-07: qty 5

## 2017-06-07 MED ORDER — LACTATED RINGERS IV SOLN
INTRAVENOUS | Status: DC
  Administered 2017-06-07: 14:00:00 via INTRAVENOUS

## 2017-06-07 MED ORDER — MIDAZOLAM HCL 2 MG/2ML IJ SOLN
INTRAMUSCULAR | Status: DC | PRN
  Administered 2017-06-07: 2 mg via INTRAVENOUS

## 2017-06-07 SURGICAL SUPPLY — 52 items
BANDAGE ACE 3X5.8 VEL STRL LF (GAUZE/BANDAGES/DRESSINGS) IMPLANT
BANDAGE ACE 4X5 VEL STRL LF (GAUZE/BANDAGES/DRESSINGS) IMPLANT
BANDAGE COBAN STERILE 2 (GAUZE/BANDAGES/DRESSINGS) ×3 IMPLANT
BNDG COHESIVE 1X5 TAN STRL LF (GAUZE/BANDAGES/DRESSINGS) IMPLANT
BNDG CONFORM 2 STRL LF (GAUZE/BANDAGES/DRESSINGS) ×3 IMPLANT
BNDG GAUZE ELAST 4 BULKY (GAUZE/BANDAGES/DRESSINGS) ×6 IMPLANT
CAP PIN ORTHO PINK (CAP) IMPLANT
CAP PIN PROTECTOR ORTHO WHT (CAP) IMPLANT
CORDS BIPOLAR (ELECTRODE) ×3 IMPLANT
COVER SURGICAL LIGHT HANDLE (MISCELLANEOUS) ×3 IMPLANT
CUFF TOURNIQUET SINGLE 18IN (TOURNIQUET CUFF) ×3 IMPLANT
CUFF TOURNIQUET SINGLE 24IN (TOURNIQUET CUFF) IMPLANT
DRAPE OEC MINIVIEW 54X84 (DRAPES) IMPLANT
DRAPE SURG 17X23 STRL (DRAPES) ×3 IMPLANT
DRSG EMULSION OIL 3X3 NADH (GAUZE/BANDAGES/DRESSINGS) ×3 IMPLANT
GAUZE SPONGE 2X2 8PLY STRL LF (GAUZE/BANDAGES/DRESSINGS) ×1 IMPLANT
GAUZE SPONGE 4X4 12PLY STRL (GAUZE/BANDAGES/DRESSINGS) IMPLANT
GAUZE XEROFORM 1X8 LF (GAUZE/BANDAGES/DRESSINGS) ×3 IMPLANT
GLOVE BIOGEL M 8.0 STRL (GLOVE) ×3 IMPLANT
GLOVE SS BIOGEL STRL SZ 8 (GLOVE) ×1 IMPLANT
GLOVE SUPERSENSE BIOGEL SZ 8 (GLOVE) ×2
GOWN STRL REUS W/ TWL LRG LVL3 (GOWN DISPOSABLE) ×2 IMPLANT
GOWN STRL REUS W/ TWL XL LVL3 (GOWN DISPOSABLE) ×3 IMPLANT
GOWN STRL REUS W/TWL LRG LVL3 (GOWN DISPOSABLE) ×6
GOWN STRL REUS W/TWL XL LVL3 (GOWN DISPOSABLE) ×9
K-WIRE SMTH SNGL TROCAR .028X4 (WIRE)
KIT BASIN OR (CUSTOM PROCEDURE TRAY) ×3 IMPLANT
KIT ROOM TURNOVER OR (KITS) ×3 IMPLANT
KWIRE SMTH SNGL TROCAR .028X4 (WIRE) IMPLANT
MANIFOLD NEPTUNE II (INSTRUMENTS) ×3 IMPLANT
NEEDLE HYPO 25GX1X1/2 BEV (NEEDLE) IMPLANT
NS IRRIG 1000ML POUR BTL (IV SOLUTION) ×3 IMPLANT
PACK ORTHO EXTREMITY (CUSTOM PROCEDURE TRAY) ×3 IMPLANT
PAD ARMBOARD 7.5X6 YLW CONV (MISCELLANEOUS) ×6 IMPLANT
PAD CAST 4YDX4 CTTN HI CHSV (CAST SUPPLIES) IMPLANT
PADDING CAST COTTON 4X4 STRL (CAST SUPPLIES)
SCRUB BETADINE 4OZ XXX (MISCELLANEOUS) ×3 IMPLANT
SOL PREP POV-IOD 4OZ 10% (MISCELLANEOUS) ×6 IMPLANT
SPONGE GAUZE 2X2 STER 10/PKG (GAUZE/BANDAGES/DRESSINGS) ×2
SUCTION FRAZIER HANDLE 10FR (MISCELLANEOUS)
SUCTION TUBE FRAZIER 10FR DISP (MISCELLANEOUS) IMPLANT
SUT MERSILENE 4 0 P 3 (SUTURE) IMPLANT
SUT PROLENE 4 0 PS 2 18 (SUTURE) IMPLANT
SUT VIC AB 2-0 CT1 27 (SUTURE)
SUT VIC AB 2-0 CT1 TAPERPNT 27 (SUTURE) IMPLANT
SYR CONTROL 10ML LL (SYRINGE) IMPLANT
TOWEL OR 17X24 6PK STRL BLUE (TOWEL DISPOSABLE) ×3 IMPLANT
TOWEL OR 17X26 10 PK STRL BLUE (TOWEL DISPOSABLE) ×3 IMPLANT
TUBE CONNECTING 12'X1/4 (SUCTIONS)
TUBE CONNECTING 12X1/4 (SUCTIONS) IMPLANT
UNDERPAD 30X30 (UNDERPADS AND DIAPERS) ×3 IMPLANT
WATER STERILE IRR 1000ML POUR (IV SOLUTION) ×3 IMPLANT

## 2017-06-07 NOTE — Anesthesia Procedure Notes (Signed)
Procedure Name: LMA Insertion Date/Time: 06/07/2017 3:35 PM Performed by: Anitra Lauth RAY Pre-anesthesia Checklist: Patient identified, Emergency Drugs available, Suction available and Patient being monitored Patient Re-evaluated:Patient Re-evaluated prior to induction Oxygen Delivery Method: Circle System Utilized Preoxygenation: Pre-oxygenation with 100% oxygen Induction Type: IV induction Ventilation: Mask ventilation without difficulty LMA: LMA inserted LMA Size: 4.0 Number of attempts: 1 Placement Confirmation: positive ETCO2 Tube secured with: Tape Dental Injury: Teeth and Oropharynx as per pre-operative assessment

## 2017-06-07 NOTE — Anesthesia Postprocedure Evaluation (Signed)
Anesthesia Post Note  Patient: Carl Mueller  Procedure(s) Performed: INCISION AND DRAINAGE AND RIGHT MIDDLE FINGER PIN HARDWARE REMOVAL X1, REPAIR AND RECONSTRUCTION AS NEEDED (Right Finger)     Patient location during evaluation: PACU Anesthesia Type: General Level of consciousness: awake and alert Pain management: pain level controlled Vital Signs Assessment: post-procedure vital signs reviewed and stable Respiratory status: spontaneous breathing, nonlabored ventilation, respiratory function stable and patient connected to nasal cannula oxygen Cardiovascular status: blood pressure returned to baseline and stable Postop Assessment: no apparent nausea or vomiting Anesthetic complications: no    Last Vitals:  Vitals:   06/07/17 1610 06/07/17 1649  BP: (!) 122/93 122/72  Pulse: (!) 49 (!) 52  Resp:  14  Temp: 36.5 C 36.5 C  SpO2: 100% 99%    Last Pain:  Vitals:   06/07/17 1649  TempSrc:   PainSc: 0-No pain                 Lowella Curb

## 2017-06-07 NOTE — Discharge Instructions (Signed)
Keep your bandage clean and dry begin range of motion immediately and call for any problems. After 1 week he can begin changing her bandage with gauze. Please see Dr. Amanda Pea in 12 days for your follow-upWe recommend that you to take vitamin C 1000 mg a day to promote healing. We also recommend that if you require  pain medicine that you take a stool softener to prevent constipation as most pain medicines will have constipation side effects. We recommend either Peri-Colace or Senokot and recommend that you also consider adding MiraLAX as well to prevent the constipation affects from pain medicine if you are required to use them. These medicines are over the counter and may be purchased at a local pharmacy. A cup of yogurt and a probiotic can also be helpful during the recovery process as the medicines can disrupt your intestinal environment. Keep bandage clean and dry.  Call for any problems.  No smoking.  Criteria for driving a car: you should be off your pain medicine for 7-8 hours, able to drive one handed(confident), thinking clearly and feeling able in your judgement to drive. Continue elevation as it will decrease swelling.  If instructed by MD move your fingers within the confines of the bandage/splint.  Use ice if instructed by your MD. Call immediately for any sudden loss of feeling in your hand/arm or change in functional abilities of the extremity.

## 2017-06-07 NOTE — Op Note (Signed)
dictation number [id number]  Status post irrigation debridement and and repair reconstruction with hardware removal and tendon adhesion surgical release  Melia Hopes M.D.

## 2017-06-07 NOTE — Anesthesia Preprocedure Evaluation (Signed)
Anesthesia Evaluation  Patient identified by MRN, date of birth, ID band Patient awake    Reviewed: Allergy & Precautions, NPO status , Patient's Chart, lab work & pertinent test results  Airway Mallampati: II       Dental  (+) Teeth Intact, Dental Advisory Given   Pulmonary Current Smoker,    breath sounds clear to auscultation       Cardiovascular negative cardio ROS   Rhythm:Regular     Neuro/Psych negative neurological ROS  negative psych ROS   GI/Hepatic negative GI ROS, Neg liver ROS,   Endo/Other  negative endocrine ROS  Renal/GU negative Renal ROS  negative genitourinary   Musculoskeletal negative musculoskeletal ROS (+)   Abdominal   Peds negative pediatric ROS (+)  Hematology negative hematology ROS (+)   Anesthesia Other Findings Day of surgery medications reviewed with the patient.  Reproductive/Obstetrics negative OB ROS                             Anesthesia Physical  Anesthesia Plan  ASA: II and emergent  Anesthesia Plan: General   Post-op Pain Management:    Induction: Intravenous  PONV Risk Score and Plan: 2 and Ondansetron and Midazolam  Airway Management Planned: LMA  Additional Equipment:   Intra-op Plan:   Post-operative Plan: Extubation in OR  Informed Consent: I have reviewed the patients History and Physical, chart, labs and discussed the procedure including the risks, benefits and alternatives for the proposed anesthesia with the patient or authorized representative who has indicated his/her understanding and acceptance.   Dental advisory given  Plan Discussed with: CRNA  Anesthesia Plan Comments:         Anesthesia Quick Evaluation

## 2017-06-07 NOTE — Transfer of Care (Signed)
Immediate Anesthesia Transfer of Care Note  Patient: Carl Mueller  Procedure(s) Performed: INCISION AND DRAINAGE AND RIGHT MIDDLE FINGER PIN HARDWARE REMOVAL X1, REPAIR AND RECONSTRUCTION AS NEEDED (Right Finger)  Patient Location: PACU  Anesthesia Type:General  Level of Consciousness: lethargic and responds to stimulation  Airway & Oxygen Therapy: Patient Spontanous Breathing and Patient connected to nasal cannula oxygen  Post-op Assessment: Report given to RN  Post vital signs: Reviewed and stable  Last Vitals:  Vitals:   06/07/17 1341  BP: 120/69  Pulse: (!) 57  Resp: 18  Temp: 36.8 C  SpO2: 100%    Last Pain:  Vitals:   06/07/17 1341  TempSrc: Oral      Patients Stated Pain Goal: 3 (06/07/17 1343)  Complications: No apparent anesthesia complications
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
## 2017-06-08 ENCOUNTER — Encounter (HOSPITAL_COMMUNITY): Payer: Self-pay | Admitting: Orthopedic Surgery
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
## 2018-11-16 ENCOUNTER — Ambulatory Visit (HOSPITAL_COMMUNITY)
Admission: EM | Admit: 2018-11-16 | Discharge: 2018-11-16 | Disposition: A | Discharge status: Home / Self Care | Payer: Self-pay | Attending: Emergency Medicine | Admitting: Emergency Medicine

## 2018-11-16 ENCOUNTER — Encounter (HOSPITAL_COMMUNITY): Payer: Self-pay

## 2018-11-16 DIAGNOSIS — Z711 Person with feared health complaint in whom no diagnosis is made: Secondary | ICD-10-CM | POA: Insufficient documentation

## 2018-11-16 DIAGNOSIS — Z202 Contact with and (suspected) exposure to infections with a predominantly sexual mode of transmission: Secondary | ICD-10-CM

## 2018-11-16 DIAGNOSIS — R369 Urethral discharge, unspecified: Secondary | ICD-10-CM | POA: Insufficient documentation

## 2018-11-16 DIAGNOSIS — Z113 Encounter for screening for infections with a predominantly sexual mode of transmission: Secondary | ICD-10-CM

## 2018-11-16 MED ORDER — CEFTRIAXONE SODIUM 250 MG IJ SOLR
250.0000 mg | Freq: Once | INTRAMUSCULAR | Status: AC
  Administered 2018-11-16: 250 mg via INTRAMUSCULAR

## 2018-11-16 MED ORDER — CEFTRIAXONE SODIUM 250 MG IJ SOLR
INTRAMUSCULAR | Status: AC
  Filled 2018-11-16: qty 250

## 2018-11-16 MED ORDER — LIDOCAINE HCL (PF) 1 % IJ SOLN
INTRAMUSCULAR | Status: AC
  Filled 2018-11-16: qty 2

## 2018-11-16 MED ORDER — AZITHROMYCIN 250 MG PO TABS
ORAL_TABLET | ORAL | Status: AC
  Filled 2018-11-16: qty 4

## 2018-11-16 MED ORDER — AZITHROMYCIN 250 MG PO TABS
1000.0000 mg | ORAL_TABLET | Freq: Once | ORAL | Status: AC
  Administered 2018-11-16: 1000 mg via ORAL

## 2018-11-16 NOTE — Discharge Instructions (Signed)
We have treated you for gonorrhea and chlamydia.  Will notify you of any positive findings and if any changes to treatment are needed.  You may monitor your results on your MyChart online as well.    You need to notify your partners of your pending testing and treatment.  Please withhold from intercourse for the next week. Please use condoms to prevent STD's.
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
## 2018-11-16 NOTE — ED Triage Notes (Signed)
Pt present penile discharge, symptoms started today.

## 2018-11-17 LAB — RPR: RPR: NONREACTIVE

## 2018-11-17 LAB — HIV ANTIBODY (ROUTINE TESTING W REFLEX): HIV Screen 4th Generation wRfx: NONREACTIVE

## 2018-11-18 LAB — URINE CYTOLOGY ANCILLARY ONLY
CHLAMYDIA, DNA PROBE: NEGATIVE
Neisseria Gonorrhea: NEGATIVE
TRICH (WINDOWPATH): NEGATIVE
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
## 2019-01-09 LAB — HM HIV SCREENING LAB: HM HIV Screening: NEGATIVE

## 2019-04-18 ENCOUNTER — Ambulatory Visit: Payer: Self-pay

## 2019-05-16 ENCOUNTER — Encounter: Payer: Self-pay | Admitting: Family Medicine

## 2019-05-16 ENCOUNTER — Other Ambulatory Visit: Payer: Self-pay

## 2019-05-16 ENCOUNTER — Ambulatory Visit: Payer: Self-pay | Admitting: Family Medicine

## 2019-05-16 DIAGNOSIS — Z202 Contact with and (suspected) exposure to infections with a predominantly sexual mode of transmission: Secondary | ICD-10-CM

## 2019-05-16 DIAGNOSIS — Z113 Encounter for screening for infections with a predominantly sexual mode of transmission: Secondary | ICD-10-CM

## 2019-05-16 MED ORDER — AZITHROMYCIN 500 MG PO TABS
1000.0000 mg | ORAL_TABLET | Freq: Once | ORAL | Status: AC
  Administered 2019-05-16: 1000 mg via ORAL

## 2019-05-16 MED ORDER — AZITHROMYCIN 500 MG PO TABS: 500.0000 mg | ORAL_TABLET | Freq: Once | ORAL | Status: DC
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
## 2019-05-16 NOTE — Progress Notes (Signed)
Here today for STD screening. Accepts bloodwork. Telitha Plath, RN Patient treated per provider orders Rocsi Hazelbaker, RN  

## 2019-05-22 ENCOUNTER — Telehealth: Payer: Self-pay | Admitting: Family Medicine

## 2019-05-22 NOTE — Telephone Encounter (Signed)
Phone call to pt. Did not leave a voicemail message, NOT the pt's name on the voicemail recording.

## 2019-05-22 NOTE — Telephone Encounter (Signed)
Phone call received back from pt. Pt knew password from last visit. Counseled pt that we do not have any results in the system at this time, we will call him if something comes back positive or if there are any issues with the tests; if everything is negative we do not call. Pt not interested in TR appt at this time.

## 2019-05-22 NOTE — Telephone Encounter (Signed)
Phone call to pt x2, kept getting voicemail for another person, NOT "Gladue." Did not leave a voicemail.

## 2019-05-23 LAB — HM HIV SCREENING LAB: HM HIV Screening: NEGATIVE

## 2019-05-23 LAB — HM HEPATITIS C SCREENING LAB: HM Hepatitis Screen: NEGATIVE

## 2019-05-23 LAB — HEPATITIS B SURFACE ANTIGEN: Hepatitis B Surface Ag: NONREACTIVE

## 2019-06-05 LAB — CT, NG, MYCOPLASMAS NAA, URINE
Chlamydia trachomatis, NAA: NEGATIVE
Mycoplasma genitalium NAA: NEGATIVE
Mycoplasma hominis NAA: NEGATIVE
Neisseria gonorrhoeae, NAA: NEGATIVE
Ureaplasma spp NAA: POSITIVE — AB

## 2019-06-10 NOTE — Addendum Note (Signed)
Addended by: Cletis Media on: 06/10/2019 01:36 PM   Modules accepted: Orders

## 2019-08-12 ENCOUNTER — Encounter: Payer: Self-pay | Admitting: Family Medicine

## 2019-08-12 ENCOUNTER — Other Ambulatory Visit: Payer: Self-pay

## 2019-08-12 ENCOUNTER — Ambulatory Visit: Payer: Self-pay | Admitting: Family Medicine

## 2019-08-12 DIAGNOSIS — Z113 Encounter for screening for infections with a predominantly sexual mode of transmission: Secondary | ICD-10-CM

## 2019-08-12 LAB — GRAM STAIN

## 2019-08-12 MED ORDER — AZITHROMYCIN 500 MG PO TABS
500.0000 mg | ORAL_TABLET | Freq: Once | ORAL | Status: AC
  Administered 2019-08-12: 500 mg via ORAL

## 2019-08-12 MED ORDER — AZITHROMYCIN 500 MG PO TABS: 500.0000 mg | ORAL_TABLET | Freq: Once | ORAL | Status: DC
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
## 2019-08-17 LAB — GONOCOCCUS CULTURE

## 2019-08-19 LAB — HM HEPATITIS C SCREENING LAB: HM Hepatitis Screen: NEGATIVE

## 2019-08-19 LAB — HEPATITIS B SURFACE ANTIGEN

## 2019-08-19 LAB — HM HIV SCREENING LAB: HM HIV Screening: NEGATIVE

## 2019-09-17 ENCOUNTER — Other Ambulatory Visit: Payer: Self-pay

## 2019-09-17 ENCOUNTER — Encounter (HOSPITAL_COMMUNITY): Payer: Self-pay

## 2019-09-17 ENCOUNTER — Ambulatory Visit (HOSPITAL_COMMUNITY)
Admission: EM | Admit: 2019-09-17 | Discharge: 2019-09-17 | Disposition: A | Discharge status: Home / Self Care | Payer: Self-pay | Attending: Urgent Care | Admitting: Urgent Care

## 2019-09-17 DIAGNOSIS — R369 Urethral discharge, unspecified: Secondary | ICD-10-CM | POA: Insufficient documentation

## 2019-09-17 DIAGNOSIS — Z7251 High risk heterosexual behavior: Secondary | ICD-10-CM | POA: Insufficient documentation

## 2019-09-17 MED ORDER — AZITHROMYCIN 250 MG PO TABS: 1000.0000 mg | ORAL_TABLET | Freq: Once | ORAL | Status: DC

## 2019-09-17 MED ORDER — AZITHROMYCIN 250 MG PO TABS
ORAL_TABLET | ORAL | Status: AC
  Filled 2019-09-17: qty 4

## 2019-09-17 MED ORDER — LIDOCAINE HCL (PF) 1 % IJ SOLN
INTRAMUSCULAR | Status: AC
  Filled 2019-09-17: qty 2

## 2019-09-17 MED ORDER — CEFTRIAXONE SODIUM 500 MG IJ SOLR
500.0000 mg | Freq: Once | INTRAMUSCULAR | Status: AC
  Administered 2019-09-17: 500 mg via INTRAMUSCULAR

## 2019-09-17 MED ORDER — CEFTRIAXONE SODIUM 500 MG IJ SOLR
INTRAMUSCULAR | Status: AC
  Filled 2019-09-17: qty 500

## 2019-09-17 MED ORDER — DOXYCYCLINE HYCLATE 100 MG PO CAPS: 100.0000 mg | ORAL_CAPSULE | Freq: Two times a day (BID) | ORAL | 0 refills | Status: DC
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
## 2019-09-17 NOTE — Discharge Instructions (Addendum)
Avoid all forms of sexual intercourse (oral, vaginal, anal) for the next 7 days to avoid spreading/reinfecting. Return if symptoms worsen/do not resolve, you develop fever, abdominal pain, blood in your urine, or are re-exposed to an STI.  

## 2019-09-17 NOTE — ED Triage Notes (Signed)
Pt states he has penile discharge. X 3 days. Pt states it hurts a little when he voids.

## 2019-09-18 LAB — CYTOLOGY, (ORAL, ANAL, URETHRAL) ANCILLARY ONLY
Chlamydia: NEGATIVE
Neisseria Gonorrhea: NEGATIVE
Trichomonas: NEGATIVE

## 2022-03-02 ENCOUNTER — Ambulatory Visit: Payer: Self-pay | Admitting: Physician Assistant

## 2022-03-02 ENCOUNTER — Encounter: Payer: Self-pay | Admitting: Physician Assistant

## 2022-03-02 ENCOUNTER — Ambulatory Visit: Payer: Self-pay

## 2022-03-02 DIAGNOSIS — Z113 Encounter for screening for infections with a predominantly sexual mode of transmission: Secondary | ICD-10-CM

## 2022-03-02 DIAGNOSIS — N341 Nonspecific urethritis: Secondary | ICD-10-CM

## 2022-03-02 LAB — GRAM STAIN

## 2022-03-02 LAB — HM HIV SCREENING LAB: HM HIV Screening: NEGATIVE

## 2022-03-02 MED ORDER — DOXYCYCLINE HYCLATE 100 MG PO TABS: 100.0000 mg | ORAL_TABLET | Freq: Two times a day (BID) | ORAL | 0 refills | Status: AC

## 2022-03-02 NOTE — Progress Notes (Signed)
Gram stain reviewed and treated ofr NGU per standing order of Dr. Alvester Morin. Jossie Ng, RN'
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
## 2022-03-07 LAB — GONOCOCCUS CULTURE

## 2022-06-23 ENCOUNTER — Other Ambulatory Visit: Payer: Self-pay

## 2022-06-23 ENCOUNTER — Emergency Department (HOSPITAL_BASED_OUTPATIENT_CLINIC_OR_DEPARTMENT_OTHER): Payer: Self-pay

## 2022-06-23 ENCOUNTER — Encounter (HOSPITAL_BASED_OUTPATIENT_CLINIC_OR_DEPARTMENT_OTHER): Payer: Self-pay | Admitting: Urology

## 2022-06-23 DIAGNOSIS — Y9389 Activity, other specified: Secondary | ICD-10-CM | POA: Insufficient documentation

## 2022-06-23 DIAGNOSIS — W260XXA Contact with knife, initial encounter: Secondary | ICD-10-CM | POA: Insufficient documentation

## 2022-06-23 DIAGNOSIS — S61411A Laceration without foreign body of right hand, initial encounter: Secondary | ICD-10-CM | POA: Insufficient documentation
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
## 2022-06-24 ENCOUNTER — Emergency Department (HOSPITAL_BASED_OUTPATIENT_CLINIC_OR_DEPARTMENT_OTHER)
Admission: EM | Admit: 2022-06-24 | Discharge: 2022-06-24 | Disposition: A | Discharge status: Home / Self Care | Payer: Self-pay | Attending: Emergency Medicine | Admitting: Emergency Medicine

## 2022-06-24 DIAGNOSIS — S61411A Laceration without foreign body of right hand, initial encounter: Secondary | ICD-10-CM

## 2022-06-24 DIAGNOSIS — S66201A Unspecified injury of extensor muscle, fascia and tendon of right thumb at wrist and hand level, initial encounter: Secondary | ICD-10-CM

## 2022-06-24 MED ORDER — IBUPROFEN 800 MG PO TABS
800.0000 mg | ORAL_TABLET | Freq: Once | ORAL | Status: AC
  Administered 2022-06-24: 800 mg via ORAL
  Filled 2022-06-24: qty 1

## 2022-06-24 NOTE — ED Provider Notes (Signed)
[facility] EMERGENCY DEPARTMENT Provider Note   CSN: [id number] Arrival date & time: 06/23/22  2220     History  No chief complaint on file.   Carl Mueller is a 36 y.o. male.  The history is provided by the patient.  Carl Mueller is a 36 y.o. male who presents to the Emergency Department complaining of hand injury.  He presents to the ED for evaluation of injury to right hand that occurred around 6pm.  He states he was trying to cut off a zip tie with a steak knife and it went into his right thumb.  He is right hand dominant.  He has been unable to extend his thumb since the injury occurred.  Tetanus updated in 3 years ago.   Works as a Facilities manager.       Home Medications Prior to Admission medications   Medication Sig Start Date End Date Taking? Authorizing Provider  doxycycline (VIBRAMYCIN) 100 MG capsule Take 1 capsule (100 mg total) by mouth 2 (two) times daily. 09/17/19   Jaynee Eagles, PA-C  HYDROcodone-acetaminophen (NORCO) 5-325 MG tablet Take 2 tablets by mouth every 6 (six) hours as needed for moderate pain. Patient not taking: Reported on 08/12/2019 06/07/17   Roseanne Kaufman, MD      Allergies    Patient has no known allergies.    Review of Systems   Review of Systems  All other systems reviewed and are negative.   Physical Exam Updated Vital Signs BP 119/75   Pulse 66   Temp 97.8 F (36.6 C) (Oral)   Resp 18   Ht 5\' 8"  (1.727 m)   Wt 88.5 kg   SpO2 100%   BMI 29.65 kg/m  Physical Exam Vitals and nursing note reviewed.  Constitutional:      Appearance: He is well-developed.  HENT:     Head: Normocephalic and atraumatic.  Cardiovascular:     Rate and Rhythm: Normal rate and regular rhythm.  Pulmonary:     Effort: Pulmonary effort is normal. No respiratory distress.  Musculoskeletal:     Comments: 2+ right radial pulse.  There is a 1 to 1-1/2 cm laceration just proximal to the right [facility] joint that is hemostatic.  Unable to extend the  first digit.  He can weakly flex the first digit.  There is altered sensation to light touch throughout the digit but he is able to feel in the digit.  There is tenderness to palpation over the wound  Skin:    General: Skin is warm and dry.  Neurological:     Mental Status: He is alert and oriented to person, place, and time.  Psychiatric:        Behavior: Behavior normal.     ED Results / Procedures / Treatments   Labs (all labs ordered are listed, but only abnormal results are displayed) Labs Reviewed - No data to display  EKG None  Radiology DG Hand Complete Right  Result Date: 06/23/2022 CLINICAL DATA:  Puncture wound EXAM: RIGHT HAND - COMPLETE 3+ VIEW COMPARISON:  02/06/2017 FINDINGS: Wound adjacent to the first metacarpal. No radiopaque foreign body or fracture here. Chronic deformity of the third digit with residual punctate metallic densities. IMPRESSION: 1. No acute osseous abnormality. Wound adjacent to the first metacarpal 2. 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   On: 06/23/2022 22:54    Procedures Procedures    Medications Ordered in ED  Medications  ibuprofen (ADVIL) tablet 800 mg (has no administration in time range)    ED Course/ Medical Decision Making/ A&P                           Medical Decision Making Amount and/or Complexity of Data Reviewed Radiology: ordered.  Risk Prescription drug management.   Patient here for evaluation of laceration to the right hand.  He has an extensor injury to the first digit.  Based on trajectory of knife per patient do not suspect open joint.  No evidence of foreign body or fracture on imaging.  Images personally reviewed and interpreted.  Agree with radiologist interpretation.  Wound was irrigated with 1 L of normal saline by ED tech.  Discussed with Dr. Apolonio Schneiders with hand surgery-recommendation for placing in thumb spica splint with follow-up in the office this  week.  Discussed with patient treatment plan.  Discussed OTC analgesics as needed pain.        Final Clinical Impression(s) / ED Diagnoses Final diagnoses:  Unspecified injury of extensor muscle, fascia and tendon of right thumb at wrist and hand level, initial encounter  Laceration of right hand without foreign body, initial encounter    Rx / DC Orders ED Discharge Orders     None         Quintella Reichert, MD 06/24/22 [phone]

## 2022-06-24 NOTE — ED Notes (Addendum)
Pt agreeable with d/c plan as discussed by provider- this nurse has verbally reinforced d/c instructions and provided pt with written copy - pt acknowledges verbal understanding and denies any addl questions, concerns, needs- pt ambulatory at d/c with steady gait; vitals stable; no distress. RUE distal neurovascular status remains intact with velcro removable splint securely in place.

## 2022-06-24 NOTE — Discharge Instructions (Signed)
Keep your wound clean and dry.    Wear your splint until you follow up with the Hand surgeon.    Call on Monday for first available appointment.   You can take tylenol or ibuprofen, available over the counter according to label instructions as needed for pain.

## 2022-07-04 ENCOUNTER — Encounter (HOSPITAL_COMMUNITY): Payer: Self-pay | Admitting: Orthopedic Surgery

## 2022-07-04 NOTE — Progress Notes (Signed)
PCP - none Cardiologist - n/a  Chest x-ray - n/a EKG - n/a Stress Test - n/a ECHO - n/a Cardiac Cath - n/a  ICD Pacemaker/Loop - n/a  Sleep Study -  n/a CPAP - none  ERAS: Clear liquids til 1:30 PM DOS  STOP now taking any Aspirin (unless otherwise instructed by your surgeon), Aleve, Naproxen, Ibuprofen, Motrin, Advil, Goody's, BC's, all herbal medications, fish oil, and all vitamins.   Coronavirus Screening Do you have any of the following symptoms:  Cough yes/no: No Fever (>100.78F)  yes/no: No Runny nose yes/no: No Sore throat yes/no: No Difficulty breathing/shortness of breath  yes/no: No  Have you traveled in the last 14 days and where? yes/no: No  Patient verbalized understanding of instructions that were given via phone.
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
## 2022-07-05 ENCOUNTER — Ambulatory Visit (HOSPITAL_COMMUNITY)
Admission: RE | Admit: 2022-07-05 | Discharge: 2022-07-05 | Disposition: A | Discharge status: Home / Self Care | Payer: Self-pay | Source: Ambulatory Visit | Attending: Orthopedic Surgery | Admitting: Orthopedic Surgery

## 2022-07-05 ENCOUNTER — Encounter (HOSPITAL_COMMUNITY)
Admission: RE | Disposition: A | Discharge status: Home / Self Care | Payer: Self-pay | Source: Ambulatory Visit | Attending: Orthopedic Surgery

## 2022-07-05 ENCOUNTER — Ambulatory Visit (HOSPITAL_BASED_OUTPATIENT_CLINIC_OR_DEPARTMENT_OTHER): Payer: Self-pay | Admitting: Anesthesiology

## 2022-07-05 ENCOUNTER — Other Ambulatory Visit: Payer: Self-pay

## 2022-07-05 ENCOUNTER — Encounter (HOSPITAL_COMMUNITY): Payer: Self-pay | Admitting: Orthopedic Surgery

## 2022-07-05 ENCOUNTER — Ambulatory Visit (HOSPITAL_COMMUNITY): Payer: Self-pay | Admitting: Anesthesiology

## 2022-07-05 DIAGNOSIS — Y939 Activity, unspecified: Secondary | ICD-10-CM | POA: Insufficient documentation

## 2022-07-05 DIAGNOSIS — S66821A Laceration of other specified muscles, fascia and tendons at wrist and hand level, right hand, initial encounter: Secondary | ICD-10-CM

## 2022-07-05 DIAGNOSIS — S66221A Laceration of extensor muscle, fascia and tendon of right thumb at wrist and hand level, initial encounter: Secondary | ICD-10-CM | POA: Insufficient documentation

## 2022-07-05 DIAGNOSIS — I10 Essential (primary) hypertension: Secondary | ICD-10-CM | POA: Insufficient documentation

## 2022-07-05 DIAGNOSIS — W260XXA Contact with knife, initial encounter: Secondary | ICD-10-CM | POA: Insufficient documentation

## 2022-07-05 HISTORY — DX: Attention-deficit hyperactivity disorder, unspecified type: F90.9

## 2022-07-05 HISTORY — PX: WOUND EXPLORATION: SHX6188

## 2022-07-05 SURGERY — WOUND EXPLORATION
Anesthesia: Monitor Anesthesia Care | Laterality: Right

## 2022-07-05 MED ORDER — ACETAMINOPHEN 10 MG/ML IV SOLN: 1000.0000 mg | Freq: Once | INTRAVENOUS | Status: DC | PRN

## 2022-07-05 MED ORDER — CHLORHEXIDINE GLUCONATE 0.12 % MT SOLN
15.0000 mL | OROMUCOSAL | Status: AC
  Filled 2022-07-05: qty 15

## 2022-07-05 MED ORDER — CHLORHEXIDINE GLUCONATE 0.12 % MT SOLN
OROMUCOSAL | Status: AC
  Administered 2022-07-05: 15 mL via OROMUCOSAL
  Filled 2022-07-05: qty 15

## 2022-07-05 MED ORDER — OXYCODONE-ACETAMINOPHEN 10-325 MG PO TABS: 1.0000 | ORAL_TABLET | Freq: Four times a day (QID) | ORAL | 0 refills | Status: AC | PRN

## 2022-07-05 MED ORDER — ROPIVACAINE HCL 5 MG/ML IJ SOLN
INTRAMUSCULAR | Status: DC | PRN
  Administered 2022-07-05: 30 mL via PERINEURAL

## 2022-07-05 MED ORDER — PROPOFOL 500 MG/50ML IV EMUL
INTRAVENOUS | Status: DC | PRN
  Administered 2022-07-05: 200 ug/kg/min via INTRAVENOUS

## 2022-07-05 MED ORDER — CEFAZOLIN SODIUM-DEXTROSE 2-4 GM/100ML-% IV SOLN
INTRAVENOUS | Status: AC
  Filled 2022-07-05: qty 100

## 2022-07-05 MED ORDER — LACTATED RINGERS IV SOLN: INTRAVENOUS | Status: DC

## 2022-07-05 MED ORDER — BUPIVACAINE-EPINEPHRINE (PF) 0.25% -1:200000 IJ SOLN
INTRAMUSCULAR | Status: AC
  Filled 2022-07-05: qty 30

## 2022-07-05 MED ORDER — DEXAMETHASONE SODIUM PHOSPHATE 10 MG/ML IJ SOLN
INTRAMUSCULAR | Status: DC | PRN
  Administered 2022-07-05: 10 mg

## 2022-07-05 MED ORDER — FENTANYL CITRATE (PF) 100 MCG/2ML IJ SOLN: 25.0000 ug | INTRAMUSCULAR | Status: DC | PRN

## 2022-07-05 MED ORDER — MIDAZOLAM HCL 2 MG/2ML IJ SOLN
INTRAMUSCULAR | Status: AC
  Filled 2022-07-05: qty 2

## 2022-07-05 MED ORDER — FENTANYL CITRATE (PF) 100 MCG/2ML IJ SOLN
INTRAMUSCULAR | Status: AC
  Administered 2022-07-05: 100 ug
  Filled 2022-07-05: qty 2

## 2022-07-05 MED ORDER — PROPOFOL 10 MG/ML IV BOLUS
INTRAVENOUS | Status: DC | PRN
  Administered 2022-07-05: 50 mg via INTRAVENOUS

## 2022-07-05 MED ORDER — DEXAMETHASONE SODIUM PHOSPHATE 10 MG/ML IJ SOLN
INTRAMUSCULAR | Status: DC | PRN
  Administered 2022-07-05: 10 mg via INTRAVENOUS

## 2022-07-05 MED ORDER — CEFAZOLIN SODIUM-DEXTROSE 2-4 GM/100ML-% IV SOLN
2.0000 g | INTRAVENOUS | Status: AC
  Administered 2022-07-05: 2 g via INTRAVENOUS

## 2022-07-05 MED ORDER — BUPIVACAINE HCL (PF) 0.5 % IJ SOLN
INTRAMUSCULAR | Status: AC
  Filled 2022-07-05: qty 30

## 2022-07-05 MED ORDER — LIDOCAINE HCL (PF) 1 % IJ SOLN
INTRAMUSCULAR | Status: AC
  Filled 2022-07-05: qty 30

## 2022-07-05 MED ORDER — FENTANYL CITRATE (PF) 100 MCG/2ML IJ SOLN
INTRAMUSCULAR | Status: AC
  Filled 2022-07-05: qty 2

## 2022-07-05 MED ORDER — ONDANSETRON HCL 4 MG/2ML IJ SOLN
INTRAMUSCULAR | Status: DC | PRN
  Administered 2022-07-05: 4 mg via INTRAVENOUS

## 2022-07-05 MED ORDER — BUPIVACAINE HCL (PF) 0.25 % IJ SOLN
INTRAMUSCULAR | Status: AC
  Filled 2022-07-05: qty 30

## 2022-07-05 MED ORDER — MIDAZOLAM HCL 2 MG/2ML IJ SOLN
INTRAMUSCULAR | Status: DC | PRN
  Administered 2022-07-05: 2 mg via INTRAVENOUS

## 2022-07-05 SURGICAL SUPPLY — 42 items
BAG COUNTER SPONGE SURGICOUNT (BAG) ×1 IMPLANT
BAG SPNG CNTER NS LX DISP (BAG) ×1
BNDG ELASTIC 2X5.8 VLCR STR LF (GAUZE/BANDAGES/DRESSINGS) IMPLANT
BNDG ELASTIC 3X5.8 VLCR STR LF (GAUZE/BANDAGES/DRESSINGS) IMPLANT
BNDG ELASTIC 4X5.8 VLCR STR LF (GAUZE/BANDAGES/DRESSINGS) ×1 IMPLANT
BNDG GAUZE DERMACEA FLUFF 4 (GAUZE/BANDAGES/DRESSINGS) ×1 IMPLANT
BNDG GZE DERMACEA 4 6PLY (GAUZE/BANDAGES/DRESSINGS) ×1
COVER SURGICAL LIGHT HANDLE (MISCELLANEOUS) IMPLANT
CUFF TOURN SGL QUICK 18X4 (TOURNIQUET CUFF) ×1 IMPLANT
DRAPE SURG 17X11 SM STRL (DRAPES) ×2 IMPLANT
DRSG EMULSION OIL 3X3 NADH (GAUZE/BANDAGES/DRESSINGS) ×1 IMPLANT
ELECT REM PT RETURN 9FT ADLT (ELECTROSURGICAL) ×1
ELECTRODE REM PT RTRN 9FT ADLT (ELECTROSURGICAL) ×1 IMPLANT
GAUZE PAD ABD 8X10 STRL (GAUZE/BANDAGES/DRESSINGS) ×1 IMPLANT
GAUZE SPONGE 4X4 12PLY STRL (GAUZE/BANDAGES/DRESSINGS) ×1 IMPLANT
GLOVE SURG ORTHO 8.0 STRL STRW (GLOVE) ×1 IMPLANT
GOWN STRL REUS W/ TWL XL LVL3 (GOWN DISPOSABLE) ×1 IMPLANT
GOWN STRL REUS W/TWL XL LVL3 (GOWN DISPOSABLE) ×1
IV NS IRRIG 3000ML ARTHROMATIC (IV SOLUTION) ×1 IMPLANT
KIT BASIN OR (CUSTOM PROCEDURE TRAY) ×1 IMPLANT
MANIFOLD NEPTUNE II (INSTRUMENTS) ×1 IMPLANT
PACK ORTHO EXTREMITY (CUSTOM PROCEDURE TRAY) ×1 IMPLANT
PAD CAST 3X4 CTTN HI CHSV (CAST SUPPLIES) IMPLANT
PAD CAST 4YDX4 CTTN HI CHSV (CAST SUPPLIES) ×1 IMPLANT
PADDING CAST COTTON 3X4 STRL (CAST SUPPLIES) ×1
PADDING CAST COTTON 4X4 STRL (CAST SUPPLIES) ×1
PADDING UNDERCAST 2X4 STRL (CAST SUPPLIES) IMPLANT
SET CYSTO W/LG BORE CLAMP LF (SET/KITS/TRAYS/PACK) ×1 IMPLANT
SLING ARM FOAM STRAP LRG (SOFTGOODS) IMPLANT
SOAP 2 % CHG 4 OZ (WOUND CARE) ×1 IMPLANT
SPLINT FIBERGLASS 3X12 (CAST SUPPLIES) IMPLANT
SUT FIBERWIRE 4-0 18 TAPR NDL (SUTURE) ×3
SUT PROLENE 3 0 PS 2 (SUTURE) IMPLANT
SUT PROLENE 4 0 PS 2 18 (SUTURE) IMPLANT
SUT VIC AB 1 CT1 27 (SUTURE)
SUT VIC AB 1 CT1 27XBRD ANTBC (SUTURE) IMPLANT
SUT VIC AB 2-0 CT1 27 (SUTURE)
SUT VIC AB 2-0 CT1 27XBRD (SUTURE) IMPLANT
SUTURE FIBERWR 4-0 18 TAPR NDL (SUTURE) IMPLANT
SYR 20ML LL LF (SYRINGE) ×1 IMPLANT
SYR CONTROL 10ML LL (SYRINGE) ×1 IMPLANT
TOWEL GREEN STERILE (TOWEL DISPOSABLE) ×1 IMPLANT

## 2022-07-05 NOTE — Discharge Instructions (Signed)
KEEP BANDAGE CLEAN AND DRY CALL OFFICE FOR F/U APPT [phone] in 13 days KEEP HAND ELEVATED ABOVE HEART OK TO APPLY ICE TO OPERATIVE AREA CONTACT OFFICE IF ANY WORSENING PAIN OR CONCERNS.  

## 2022-07-05 NOTE — Transfer of Care (Signed)
Immediate Anesthesia Transfer of Care Note  Patient: Carl Mueller  Procedure(s) Performed: WOUND EXPLORATION AND TENDON REPAIR, RIGHT THUMB (Right)  Patient Location: PACU  Anesthesia Type:MAC  Level of Consciousness: awake and patient cooperative  Airway & Oxygen Therapy: Patient Spontanous Breathing and Patient connected to face mask oxygen  Post-op Assessment: Report given to RN, Post -op Vital signs reviewed and stable, and Patient moving all extremities  Post vital signs: Reviewed and stable  Last Vitals:  Vitals Value Taken Time  BP 102/62 1755  Temp 97.6 1755  Pulse 65 1755  Resp 9 1755  SpO2 100 1755    Last Pain:  Vitals:   07/05/22 1650  TempSrc:   PainSc: 0-No pain      Patients Stated Pain Goal: 5 (83/41/96 2229)  Complications: No notable events documented.

## 2022-07-05 NOTE — Op Note (Signed)
PREOPERATIVE DIAGNOSIS: Right thumb laceration with tendon involvement  POSTOPERATIVE DIAGNOSIS: Same  ATTENDING SURGEON: Dr. Iran Planas who scrubbed and present for the entire procedure  ASSISTANT SURGEON: Gertie Fey, PA-C was scrubbed necessary for tendon repair closure and splinting in a timely fashion  ANESTHESIA: Regional with IV sedation  OPERATIVE PROCEDURE: Right thumb extensor pollicis longus tendon repair, primary tendon repair dorsal aspect of the [facility] region. Right thumb extensor pollicis brevis tendon repair primary tendon repair dorsal aspect of the hand  IMPLANTS: None  EBL: Minimal  RADIOGRAPHIC INTERPRETATION: None  SURGICAL INDICATIONS: Patient is a right-hand-dominant gentleman who sustained a laceration to the dorsal aspect of the right thumb.  Patient had the inability to extend the thumb from the palm flat position.  Patient was seen and evaluated and recommended undergo the above procedure.  The risks of surgery include but not limited to bleeding infection damage nearby nerves arteries or tendons loss of motion of the wrist and digits incomplete relief of symptoms and need for further surgical invention.  Signed informed consent was obtained on the day of surgery.  SURGICAL TECHNIQUE: The patient was prepped identified in the preoperative holding area marked apart a marker made on the right thumb indicate correct operative site.  Patient brought back to operating placed supine on the anesthesia table where the regional anesthetic was administered.  Patient tolerated this well.  A well-padded tourniquet was then placed on the right brachium and sealed with the appropriate drape.  The right upper extremities then prepped and draped normal sterile fashion.  Preoperative antibiotics were given prior to skin incision.  Attention was then turned to the right thumb.  The laceration was then extended proximally and distally to expose the extensor mechanism.  The patient  had a complete disruption of the EPL.  The tendon edges were then carefully identified proximally distally and the tendon was then reapproximated.  4-0 FiberWire suture was then used figure-of-eight and horizontal mattress sutures to reapproximate the EPL tendon.  This came together very nicely.  Similar technique was used to repair the extensor pollicis brevis.  This was repaired with 4-0 FiberWire suture in a locking running 4-0 FiberWire suture.  Both tendons were repaired nicely over the dorsal aspect of the thumb and hand.  The wound was then thoroughly irrigated.  After thorough wound irrigation the skin was then closed using simple Prolene sutures.  Adaptic dressing sterile compressive bandage then applied.  The patient was placed in well-padded thumb spica splint taken recovery room in good condition.  POSTOPERATIVE PLAN: Patient be discharged to home.  See him back in the office in 2 weeks for wound check application of a thumb spica cast immobilization for total of 4 weeks.  At the 4-week mark then taken out of the cast and then get him into an outpatient therapy regimen.  No radiographs the first visit.  Placed the therapy order the first postoperative visit.

## 2022-07-05 NOTE — Progress Notes (Signed)
R/B/A DISCUSSED WITH PT IN OFFICE.  PT VOICED UNDERSTANDING OF PLAN CONSENT SIGNED DAY OF SURGERY PT SEEN AND EXAMINED PRIOR TO OPERATIVE PROCEDURE/DAY OF SURGERY SITE MARKED. QUESTIONS ANSWERED WILL GO HOME FOLLOWING SURGERY  

## 2022-07-05 NOTE — Anesthesia Preprocedure Evaluation (Addendum)
Anesthesia Evaluation  Patient identified by MRN, date of birth, ID band Patient awake    Reviewed: Allergy & Precautions, NPO status , Patient's Chart, lab work & pertinent test results  Airway Mallampati: II  TM Distance: >3 FB Neck ROM: Full    Dental no notable dental hx.    Pulmonary neg pulmonary ROS,    Pulmonary exam normal        Cardiovascular hypertension,  Rhythm:Regular Rate:Normal     Neuro/Psych  Headaches, negative psych ROS   GI/Hepatic negative GI ROS, Neg liver ROS,   Endo/Other  negative endocrine ROS  Renal/GU negative Renal ROS  negative genitourinary   Musculoskeletal Right hand laceration   Abdominal Normal abdominal exam  (+)   Peds  Hematology negative hematology ROS (+)   Anesthesia Other Findings   Reproductive/Obstetrics                             Anesthesia Physical Anesthesia Plan  ASA: 2  Anesthesia Plan: MAC and Regional   Post-op Pain Management: Regional block*   Induction: Intravenous  PONV Risk Score and Plan: 1 and Ondansetron, Dexamethasone, Midazolam, Treatment may vary due to age or medical condition and Propofol infusion  Airway Management Planned: Simple Face Mask, Natural Airway and Nasal Cannula  Additional Equipment: None  Intra-op Plan:   Post-operative Plan:   Informed Consent: I have reviewed the patients History and Physical, chart, labs and discussed the procedure including the risks, benefits and alternatives for the proposed anesthesia with the patient or authorized representative who has indicated his/her understanding and acceptance.     Dental advisory given  Plan Discussed with:   Anesthesia Plan Comments:        Anesthesia Quick Evaluation

## 2022-07-05 NOTE — Anesthesia Procedure Notes (Signed)
Anesthesia Regional Block: Supraclavicular block   Pre-Anesthetic Checklist: , timeout performed,  Correct Patient, Correct Site, Correct Laterality,  Correct Procedure, Correct Position, site marked,  Risks and benefits discussed,  Surgical consent,  Pre-op evaluation,  At surgeon's request and post-op pain management  Laterality: Right  Prep: Dura Prep       Needles:  Injection technique: Single-shot  Needle Type: Echogenic Stimulator Needle     Needle Length: 5cm  Needle Gauge: 20     Additional Needles:   Procedures:,,,, ultrasound used (permanent image in chart),,    Narrative:  Start time: 07/05/2022 4:40 PM End time: 07/05/2022 4:44 PM Injection made incrementally with aspirations every 5 mL.  Performed by: Personally  Anesthesiologist: Darral Dash, DO  Additional Notes: Patient identified. Risks/Benefits/Options discussed with patient including but not limited to bleeding, infection, nerve damage, failed block, incomplete pain control. Patient expressed understanding and wished to proceed. All questions were answered. Sterile technique was used throughout the entire procedure. Please see nursing notes for vital signs. Aspirated in 5cc intervals with injection for negative confirmation. Patient was given instructions on fall risk and not to get out of bed. All questions and concerns addressed with instructions to call with any issues or inadequate analgesia.

## 2022-07-05 NOTE — Progress Notes (Signed)
No labs needed per anesthesia 

## 2022-07-06 ENCOUNTER — Encounter (HOSPITAL_COMMUNITY): Payer: Self-pay | Admitting: Orthopedic Surgery

## 2022-07-06 NOTE — Anesthesia Postprocedure Evaluation (Signed)
Anesthesia Post Note  Patient: Carl Mueller  Procedure(s) Performed: WOUND EXPLORATION AND TENDON REPAIR, RIGHT THUMB (Right)     Patient location during evaluation: PACU Anesthesia Type: Regional and MAC Level of consciousness: awake and alert Pain management: pain level controlled Vital Signs Assessment: post-procedure vital signs reviewed and stable Respiratory status: spontaneous breathing, nonlabored ventilation, respiratory function stable and patient connected to nasal cannula oxygen Cardiovascular status: stable and blood pressure returned to baseline Postop Assessment: no apparent nausea or vomiting Anesthetic complications: no   No notable events documented.  Last Vitals:  Vitals:   07/05/22 1830 07/05/22 1845  BP: 116/77 118/84  Pulse: (!) 51 (!) 57  Resp: 10 11  Temp:  36.6 C  SpO2: 100% 100%    Last Pain:  Vitals:   07/05/22 1845  TempSrc:   PainSc: 0-No pain                 Belenda Cruise P Alma Muegge
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
## 2022-08-02 ENCOUNTER — Ambulatory Visit: Payer: MEDICAID | Admitting: Rehabilitative and Restorative Service Providers"

## 2022-08-02 ENCOUNTER — Ambulatory Visit: Payer: Self-pay | Admitting: Rehabilitative and Restorative Service Providers"
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
## 2022-08-09 ENCOUNTER — Encounter: Payer: Self-pay | Admitting: Rehabilitative and Restorative Service Providers"

## 2022-08-09 ENCOUNTER — Ambulatory Visit: Payer: Self-pay | Attending: Orthopedic Surgery | Admitting: Rehabilitative and Restorative Service Providers"

## 2022-08-09 ENCOUNTER — Other Ambulatory Visit: Payer: Self-pay

## 2022-08-09 DIAGNOSIS — R202 Paresthesia of skin: Secondary | ICD-10-CM | POA: Insufficient documentation

## 2022-08-09 DIAGNOSIS — M79641 Pain in right hand: Secondary | ICD-10-CM | POA: Insufficient documentation

## 2022-08-09 DIAGNOSIS — R278 Other lack of coordination: Secondary | ICD-10-CM | POA: Insufficient documentation

## 2022-08-09 DIAGNOSIS — R600 Localized edema: Secondary | ICD-10-CM | POA: Insufficient documentation

## 2022-08-09 DIAGNOSIS — M6281 Muscle weakness (generalized): Secondary | ICD-10-CM | POA: Insufficient documentation

## 2022-08-09 DIAGNOSIS — M25641 Stiffness of right hand, not elsewhere classified: Secondary | ICD-10-CM | POA: Insufficient documentation

## 2022-08-09 DIAGNOSIS — M25631 Stiffness of right wrist, not elsewhere classified: Secondary | ICD-10-CM | POA: Insufficient documentation
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
## 2022-08-14 ENCOUNTER — Encounter: Payer: Self-pay | Admitting: Rehabilitative and Restorative Service Providers"

## 2022-08-14 ENCOUNTER — Ambulatory Visit: Payer: Self-pay | Admitting: Rehabilitative and Restorative Service Providers"

## 2022-08-14 DIAGNOSIS — M25641 Stiffness of right hand, not elsewhere classified: Secondary | ICD-10-CM

## 2022-08-14 DIAGNOSIS — M25631 Stiffness of right wrist, not elsewhere classified: Secondary | ICD-10-CM

## 2022-08-14 DIAGNOSIS — M79641 Pain in right hand: Secondary | ICD-10-CM

## 2022-08-14 DIAGNOSIS — R278 Other lack of coordination: Secondary | ICD-10-CM

## 2022-08-14 DIAGNOSIS — R600 Localized edema: Secondary | ICD-10-CM

## 2022-08-14 DIAGNOSIS — R202 Paresthesia of skin: Secondary | ICD-10-CM

## 2022-08-14 DIAGNOSIS — M6281 Muscle weakness (generalized): Secondary | ICD-10-CM

## 2022-08-21 ENCOUNTER — Telehealth: Payer: Self-pay | Admitting: Rehabilitative and Restorative Service Providers"

## 2022-08-21 ENCOUNTER — Ambulatory Visit: Payer: Self-pay | Admitting: Rehabilitative and Restorative Service Providers"
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
## 2022-08-21 NOTE — Telephone Encounter (Signed)
OT called patient to discuss missed appointment. OT spoke with pt about next appointment date/time. They were reminded of the attendance policy and future missed appointments could lead to early discharge from therapy.  
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
## 2022-08-23 ENCOUNTER — Encounter: Payer: Self-pay | Admitting: Rehabilitative and Restorative Service Providers"

## 2022-08-23 ENCOUNTER — Ambulatory Visit: Payer: Self-pay | Admitting: Rehabilitative and Restorative Service Providers"

## 2022-08-23 DIAGNOSIS — R202 Paresthesia of skin: Secondary | ICD-10-CM

## 2022-08-23 DIAGNOSIS — M79641 Pain in right hand: Secondary | ICD-10-CM

## 2022-08-23 DIAGNOSIS — M25631 Stiffness of right wrist, not elsewhere classified: Secondary | ICD-10-CM

## 2022-08-23 DIAGNOSIS — R600 Localized edema: Secondary | ICD-10-CM

## 2022-08-23 DIAGNOSIS — M6281 Muscle weakness (generalized): Secondary | ICD-10-CM

## 2022-08-23 DIAGNOSIS — M25641 Stiffness of right hand, not elsewhere classified: Secondary | ICD-10-CM
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
## 2022-09-06 ENCOUNTER — Telehealth: Payer: Self-pay | Admitting: Rehabilitative and Restorative Service Providers"

## 2022-09-06 ENCOUNTER — Ambulatory Visit: Payer: Self-pay | Attending: Orthopedic Surgery | Admitting: Rehabilitative and Restorative Service Providers"

## 2022-09-06 NOTE — Telephone Encounter (Signed)
OT called patient to discuss 2nd no-show appointment, but he does not answer and his voice mailbox is full.  He has not contacted [place] and will be discharged now per policies.  If additional therapy is needed, please see physician for new referral.

## 2023-02-03 ENCOUNTER — Emergency Department (HOSPITAL_COMMUNITY)
Admission: EM | Admit: 2023-02-03 | Discharge: 2023-02-03 | Disposition: A | Discharge status: Home / Self Care | Payer: BC Managed Care – PPO | Attending: Emergency Medicine | Admitting: Emergency Medicine

## 2023-02-03 ENCOUNTER — Emergency Department (HOSPITAL_COMMUNITY): Payer: BC Managed Care – PPO

## 2023-02-03 ENCOUNTER — Other Ambulatory Visit: Payer: Self-pay

## 2023-02-03 ENCOUNTER — Encounter (HOSPITAL_COMMUNITY): Payer: Self-pay

## 2023-02-03 DIAGNOSIS — R519 Headache, unspecified: Secondary | ICD-10-CM | POA: Diagnosis not present

## 2023-02-03 DIAGNOSIS — M79641 Pain in right hand: Secondary | ICD-10-CM | POA: Diagnosis not present

## 2023-02-03 DIAGNOSIS — M545 Low back pain, unspecified: Secondary | ICD-10-CM | POA: Diagnosis not present

## 2023-02-03 DIAGNOSIS — Z653 Problems related to other legal circumstances: Secondary | ICD-10-CM | POA: Insufficient documentation

## 2023-02-03 DIAGNOSIS — S80911A Unspecified superficial injury of right knee, initial encounter: Secondary | ICD-10-CM | POA: Diagnosis not present

## 2023-02-03 DIAGNOSIS — M542 Cervicalgia: Secondary | ICD-10-CM | POA: Diagnosis not present

## 2023-02-03 DIAGNOSIS — W19XXXA Unspecified fall, initial encounter: Secondary | ICD-10-CM | POA: Diagnosis not present

## 2023-02-03 DIAGNOSIS — M25561 Pain in right knee: Secondary | ICD-10-CM | POA: Diagnosis not present

## 2023-02-03 MED ORDER — IBUPROFEN 800 MG PO TABS
800.0000 mg | ORAL_TABLET | Freq: Once | ORAL | Status: AC
  Administered 2023-02-03: 800 mg via ORAL
  Filled 2023-02-03: qty 1

## 2023-02-03 NOTE — ED Notes (Signed)
Brace applied by Ed tech
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
## 2023-02-03 NOTE — ED Notes (Signed)
Pt noted lying on stretcher with eyes closed, appears to be in no acute distress. Upon entering room, patient reporting head/neck pain, requesting Ct scan. Pt did not loose consciousness. No obvious sign of head injury/neck injury seen. Provider made aware. Patient was fully examined by provider as well. Advised no indication for Ct scan was made at this time.

## 2023-03-30 ENCOUNTER — Ambulatory Visit (HOSPITAL_COMMUNITY)
Admission: EM | Admit: 2023-03-30 | Discharge: 2023-03-30 | Disposition: A | Discharge status: Home / Self Care | Payer: BC Managed Care – PPO | Attending: Emergency Medicine | Admitting: Emergency Medicine

## 2023-03-30 ENCOUNTER — Encounter (HOSPITAL_COMMUNITY): Payer: Self-pay

## 2023-03-30 DIAGNOSIS — S39012A Strain of muscle, fascia and tendon of lower back, initial encounter: Secondary | ICD-10-CM | POA: Diagnosis not present

## 2023-03-30 DIAGNOSIS — S161XXA Strain of muscle, fascia and tendon at neck level, initial encounter: Secondary | ICD-10-CM

## 2023-03-30 MED ORDER — KETOROLAC TROMETHAMINE 60 MG/2ML IM SOLN
INTRAMUSCULAR | Status: AC
  Filled 2023-03-30: qty 2

## 2023-03-30 MED ORDER — METHOCARBAMOL 500 MG PO TABS: 500.0000 mg | ORAL_TABLET | Freq: Two times a day (BID) | ORAL | 0 refills | Status: AC

## 2023-03-30 MED ORDER — NAPROXEN 500 MG PO TABS: 500.0000 mg | ORAL_TABLET | Freq: Two times a day (BID) | ORAL | 0 refills | Status: DC

## 2023-03-30 MED ORDER — KETOROLAC TROMETHAMINE 30 MG/ML IJ SOLN
30.0000 mg | Freq: Once | INTRAMUSCULAR | Status: AC
  Administered 2023-03-30: 30 mg via INTRAMUSCULAR

## 2023-03-30 NOTE — Discharge Instructions (Addendum)
Overall your symptoms appear to be musculoskeletal.  We have given you an injection of Toradol today in clinic, you can start the naproxen tomorrow.  You can take the muscle relaxer up to 2 times daily, do not drink or drive on this medication.  Please continue to rest, heat the area and use gentle stretching.  If your pain persist beyond the next week, please follow-up with [facility] Sports Medicine.  Please return to clinic for any new or urgent symptoms.
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
## 2023-04-16 ENCOUNTER — Encounter: Payer: Self-pay | Admitting: Family Medicine

## 2023-04-16 ENCOUNTER — Ambulatory Visit (INDEPENDENT_AMBULATORY_CARE_PROVIDER_SITE_OTHER): Payer: BC Managed Care – PPO | Admitting: Family Medicine

## 2023-04-16 VITALS — BP 138/88 | Ht 71.0 in | Wt 185.0 lb

## 2023-04-16 DIAGNOSIS — M542 Cervicalgia: Secondary | ICD-10-CM

## 2023-04-16 MED ORDER — TIZANIDINE HCL 4 MG PO TABS: 4.0000 mg | ORAL_TABLET | Freq: Three times a day (TID) | ORAL | 1 refills | Status: DC | PRN

## 2023-04-16 MED ORDER — DICLOFENAC SODIUM 75 MG PO TBEC: 75.0000 mg | DELAYED_RELEASE_TABLET | Freq: Two times a day (BID) | ORAL | 1 refills | Status: DC
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
## 2023-05-10 DIAGNOSIS — M542 Cervicalgia: Secondary | ICD-10-CM | POA: Diagnosis not present

## 2023-05-10 DIAGNOSIS — M2569 Stiffness of other specified joint, not elsewhere classified: Secondary | ICD-10-CM | POA: Diagnosis not present

## 2023-05-10 DIAGNOSIS — R293 Abnormal posture: Secondary | ICD-10-CM | POA: Diagnosis not present

## 2023-05-10 DIAGNOSIS — M6281 Muscle weakness (generalized): Secondary | ICD-10-CM | POA: Diagnosis not present

## 2023-05-15 ENCOUNTER — Other Ambulatory Visit: Payer: BC Managed Care – PPO

## 2023-05-28 ENCOUNTER — Ambulatory Visit
Admission: RE | Admit: 2023-05-28 | Discharge: 2023-05-28 | Disposition: A | Discharge status: Home / Self Care | Payer: BC Managed Care – PPO | Source: Ambulatory Visit | Attending: Family Medicine | Admitting: Family Medicine

## 2023-05-28 ENCOUNTER — Encounter: Payer: Self-pay | Admitting: Family Medicine

## 2023-05-28 ENCOUNTER — Ambulatory Visit: Payer: BC Managed Care – PPO | Admitting: Family Medicine

## 2023-05-28 VITALS — BP 118/72 | Ht 71.0 in | Wt 190.0 lb

## 2023-05-28 DIAGNOSIS — M542 Cervicalgia: Secondary | ICD-10-CM

## 2023-05-28 MED ORDER — DICLOFENAC SODIUM 75 MG PO TBEC: 75.0000 mg | DELAYED_RELEASE_TABLET | Freq: Two times a day (BID) | ORAL | 1 refills | Status: AC

## 2023-05-28 MED ORDER — TIZANIDINE HCL 4 MG PO TABS: 4.0000 mg | ORAL_TABLET | Freq: Three times a day (TID) | ORAL | 1 refills | Status: AC | PRN

## 2023-05-28 MED ORDER — KETOROLAC TROMETHAMINE 60 MG/2ML IM SOLN
60.0000 mg | Freq: Once | INTRAMUSCULAR | Status: AC
Start: 2023-05-28 — End: 2023-05-28
  Administered 2023-05-28: 60 mg via INTRAMUSCULAR
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
## 2023-05-28 NOTE — Patient Instructions (Signed)
Get x-rays after you leave today If these are normal we will go ahead with an MRI of your cervical spine to assess for a disc herniation. Diclofenac 75mg  twice a day with food for pain and inflammation Tizanidine three times a day as needed for muscle spasms (do not drive with this if it makes you sleepy). Continue the home exercises you learned in physical therapy. Heat 15 minutes at a time 3-4 times a day to help with spasms. Watch head position when on computers, texting, when sleeping in bed - should in line with back to prevent further muscle strain. We will call you with results.

## 2023-06-06 ENCOUNTER — Telehealth: Payer: Self-pay
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
## 2023-06-09 ENCOUNTER — Ambulatory Visit
Admission: RE | Admit: 2023-06-09 | Discharge: 2023-06-09 | Disposition: A | Discharge status: Home / Self Care | Payer: BC Managed Care – PPO | Source: Ambulatory Visit | Attending: Family Medicine | Admitting: Family Medicine

## 2023-06-09 DIAGNOSIS — M50221 Other cervical disc displacement at C4-C5 level: Secondary | ICD-10-CM | POA: Diagnosis not present

## 2023-06-09 DIAGNOSIS — M50223 Other cervical disc displacement at C6-C7 level: Secondary | ICD-10-CM | POA: Diagnosis not present

## 2023-06-09 DIAGNOSIS — M50222 Other cervical disc displacement at C5-C6 level: Secondary | ICD-10-CM | POA: Diagnosis not present

## 2023-06-09 DIAGNOSIS — M542 Cervicalgia: Secondary | ICD-10-CM

## 2023-11-06 ENCOUNTER — Encounter: Payer: Self-pay | Admitting: Nurse Practitioner

## 2023-11-06 ENCOUNTER — Ambulatory Visit: Payer: Self-pay | Admitting: Nurse Practitioner

## 2023-11-06 DIAGNOSIS — Z202 Contact with and (suspected) exposure to infections with a predominantly sexual mode of transmission: Secondary | ICD-10-CM

## 2023-11-06 DIAGNOSIS — Z113 Encounter for screening for infections with a predominantly sexual mode of transmission: Secondary | ICD-10-CM

## 2023-11-06 LAB — GRAM STAIN

## 2023-11-06 LAB — HM HIV SCREENING LAB: HM HIV Screening: NEGATIVE

## 2023-11-06 LAB — HEPATITIS B SURFACE ANTIGEN

## 2023-11-06 LAB — HM HEPATITIS C SCREENING LAB: HM Hepatitis Screen: NEGATIVE

## 2023-11-06 MED ORDER — METRONIDAZOLE 500 MG PO TABS
2000.0000 mg | ORAL_TABLET | Freq: Once | ORAL | Status: AC
Start: 2023-11-06 — End: 2023-11-06

## 2023-11-06 NOTE — Progress Notes (Signed)
 Pt is here for STD screening. The patient was dispensed Metronidazole 2000 mg for once today. I provided counseling today regarding the medication. We discussed the medication, the side effects and when to call clinic. Patient given the opportunity to ask questions for any clarification. Condoms declined. Sonda Primes, RN.
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
## 2023-11-08 LAB — CHLAMYDIA/GC NAA, CONFIRMATION
Chlamydia trachomatis, NAA: NEGATIVE
Neisseria gonorrhoeae, NAA: NEGATIVE
